# Patient Record
Sex: Female | Born: 1975 | Race: White | Hispanic: No | Marital: Single | Smoking: Never smoker
Health system: Southern US, Community
[De-identification: ages and names within clinical notes are randomized; demographics above are authoritative.]

## PROBLEM LIST (undated history)

## (undated) DIAGNOSIS — I10 Essential (primary) hypertension: Secondary | ICD-10-CM

## (undated) DIAGNOSIS — D649 Anemia, unspecified: Secondary | ICD-10-CM

## (undated) DIAGNOSIS — G43909 Migraine, unspecified, not intractable, without status migrainosus: Secondary | ICD-10-CM

## (undated) HISTORY — PX: TUBAL LIGATION: SHX77

## (undated) HISTORY — DX: Anemia, unspecified: D64.9

## (undated) HISTORY — DX: Essential (primary) hypertension: I10

---

## 1997-10-11 ENCOUNTER — Other Ambulatory Visit: Admission: RE | Admit: 1997-10-11 | Discharge: 1997-10-11 | Payer: Self-pay | Admitting: Obstetrics and Gynecology

## 1998-05-18 ENCOUNTER — Other Ambulatory Visit: Admission: RE | Admit: 1998-05-18 | Discharge: 1998-05-18 | Payer: Self-pay | Admitting: Obstetrics and Gynecology

## 1999-08-08 ENCOUNTER — Other Ambulatory Visit: Admission: RE | Admit: 1999-08-08 | Discharge: 1999-08-08 | Payer: Self-pay | Admitting: Gynecology

## 2008-02-10 ENCOUNTER — Inpatient Hospital Stay (HOSPITAL_COMMUNITY): Admission: AD | Admit: 2008-02-10 | Discharge: 2008-02-10 | Payer: Self-pay | Admitting: Obstetrics & Gynecology

## 2009-10-05 ENCOUNTER — Encounter: Admission: RE | Admit: 2009-10-05 | Discharge: 2009-10-05 | Payer: Self-pay | Admitting: Family Medicine

## 2011-03-06 LAB — URINALYSIS, ROUTINE W REFLEX MICROSCOPIC
Bilirubin Urine: NEGATIVE
Hgb urine dipstick: NEGATIVE
Specific Gravity, Urine: >1.03 — ABNORMAL HIGH

## 2011-03-06 LAB — CBC
HCT: 40.7
Hemoglobin: 13.5
MCHC: 33.2
MCV: 92.3
Platelets: 225
RDW: 12.5
WBC: 9.8

## 2011-03-06 LAB — WET PREP, GENITAL
Clue Cells Wet Prep HPF POC: NONE SEEN
Yeast Wet Prep HPF POC: NONE SEEN

## 2011-03-06 LAB — GC/CHLAMYDIA PROBE AMP, GENITAL: GC Probe Amp, Genital: NEGATIVE

## 2011-03-06 LAB — POCT PREGNANCY, URINE: Preg Test, Ur: NEGATIVE

## 2011-12-26 ENCOUNTER — Ambulatory Visit (INDEPENDENT_AMBULATORY_CARE_PROVIDER_SITE_OTHER): Payer: BC Managed Care – PPO | Admitting: Family Medicine

## 2011-12-26 VITALS — BP 116/76 | HR 78 | Temp 98.7°F | Resp 16 | Ht 63.0 in | Wt 172.0 lb

## 2011-12-26 DIAGNOSIS — IMO0002 Reserved for concepts with insufficient information to code with codable children: Secondary | ICD-10-CM

## 2011-12-26 DIAGNOSIS — M25549 Pain in joints of unspecified hand: Secondary | ICD-10-CM

## 2011-12-26 MED ORDER — TRAMADOL HCL 50 MG PO TABS: 50.0000 mg | ORAL_TABLET | Freq: Three times a day (TID) | ORAL | Status: AC | PRN

## 2011-12-26 NOTE — Patient Instructions (Addendum)
Take the Tramadol every 6 hours for pain relief.   Change the bandage as you need to and soak as instructed. Come back Thursday or Friday next week so we can look at your thumb and see how it's doing.  If you actually do have a fungus you may need a medicine for that.   It was good to meet you!

## 2011-12-26 NOTE — Progress Notes (Signed)
Patient ID: Sandy Ramsey, female   DOB: 1975/12/30, 36 y.o.   MRN: [id number] Sandy Ramsey is a 36 y.o. female who presents to Urgent Care today for Left thumb pain:  1.  Left thumb pain:  Present for 1-2 months.  Works as Sports administrator.  Has been using increased rust remover chemical at work for past several weeks and has had worsening tingling in her fingers since then.  Noted swelling and pain Left thumb x 1 week.  Increasing in pain and redness.  Pain radiates down left thumb, pain with movement.  Has tried OTC Advil without relief of pain or swelling.   PMH reviewed.  ROS as above otherwise neg.  No chest pain, palpitations, SOB, Fever, Chills, Abd pain, N/V/D.  Medications reviewed. No current outpatient prescriptions on file.    Exam:  BP 116/76  Pulse 78  Temp 98.7 F (37.1 C) (Oral)  Resp 16  Ht 5\' 3"  (1.6 m)  Wt 172 lb (78.019 kg)  BMI 30.47 kg/m2  SpO2 100% Gen: Well NAD HEENT: EOMI,  MMM Lungs: CTABL Nl WOB Heart: RRR no MRG Abd: NABS, NT, ND Exts: Left thumb with paronychia noted lateral nail fold.  Tender to palpation. Also tender to palpation along extensor tendon, although no redness or swelling here.  No anatomic snuffbox tenderness.   Neuro:  Sensation intact all distal fingers. CV:  Radial/ulnar pulses +2 BL   Assessment and Plan:  1.  Paronychia:  No red flags by history or physical.  Incised and drained in clinic.  Much relief afterwards.  Believe this is likely cause of her pain.  However she might also have de Quervain's tenosynovitis as she was tender along extensor tendon.  Unsure about this, she should return next week for recheck of her wound and to further examine whether pain is totally relieved or if she is having some tendonitis as well.  Ibuprofen as anti-inflammatory along with prescription Tramadol for short term pain relief.

## 2011-12-26 NOTE — Progress Notes (Signed)
VCO.  Digital block with 4 cc 2% lidocaine plain.  Sterile prep with betadine.  11 blade to incise along the lateral nail fold.  Moderate purulence expressed.  Cleansed.  Antibiotic ointment and bandage applied.    Advised warm water soaks, keep the wound covered while at work.

## 2011-12-30 LAB — WOUND CULTURE
Gram Stain: NONE SEEN
Organism ID, Bacteria: NO GROWTH

## 2012-03-02 ENCOUNTER — Ambulatory Visit (INDEPENDENT_AMBULATORY_CARE_PROVIDER_SITE_OTHER): Payer: BC Managed Care – PPO | Admitting: Family Medicine

## 2012-03-02 VITALS — BP 118/74 | HR 87 | Temp 98.4°F | Resp 16 | Ht 63.0 in | Wt 179.0 lb

## 2012-03-02 DIAGNOSIS — Z23 Encounter for immunization: Secondary | ICD-10-CM

## 2012-03-02 DIAGNOSIS — Z0000 Encounter for general adult medical examination without abnormal findings: Secondary | ICD-10-CM

## 2012-03-02 DIAGNOSIS — R350 Frequency of micturition: Secondary | ICD-10-CM

## 2012-03-02 LAB — COMPREHENSIVE METABOLIC PANEL
AST: 16 U/L (ref 0–37)
Alkaline Phosphatase: 62 U/L (ref 39–117)
Chloride: 106 mEq/L (ref 96–112)
Creat: 0.63 mg/dL (ref 0.50–1.10)
Sodium: 139 mEq/L (ref 135–145)
Total Protein: 6.9 g/dL (ref 6.0–8.3)

## 2012-03-02 LAB — POCT CBC
Granulocyte percent: 70.3 %G (ref 37–80)
MCHC: 30.8 g/dL — AB (ref 31.8–35.4)
MCV: 93.7 fL (ref 80–97)
MID (cbc): 0.4 (ref 0–0.9)
MPV: 9 fL (ref 0–99.8)
POC MID %: 5.3 %M (ref 0–12)
Platelet Count, POC: 305 10*3/uL (ref 142–424)

## 2012-03-02 LAB — POCT UA - MICROSCOPIC ONLY
Bacteria, U Microscopic: NEGATIVE
Crystals, Ur, HPF, POC: NEGATIVE
Mucus, UA: POSITIVE
Yeast, UA: NEGATIVE

## 2012-03-02 LAB — POCT URINALYSIS DIPSTICK
Bilirubin, UA: NEGATIVE
Glucose, UA: NEGATIVE
Ketones, UA: NEGATIVE
Leukocytes, UA: NEGATIVE
Nitrite, UA: NEGATIVE
Protein, UA: NEGATIVE
pH, UA: 7

## 2012-03-02 LAB — POCT URINE PREGNANCY: Preg Test, Ur: NEGATIVE

## 2012-03-02 NOTE — Progress Notes (Signed)
[facility] [address], [place] [address] [phone]  Date:  03/02/2012   Name:  Sandy Ramsey   DOB:  1975-08-07   MRN:  [id number]  PCP:  Abbe Amsterdam, MD    Chief Complaint: Complete PE with PPW   History of Present Illness:  Sandy Ramsey is a 36 y.o. very pleasant female patient who presents with the following:  She is here to have a CPE today and to have a form completed for her job.  She had a baseline mammogram at age 56- told to follow- up at 36 years old.  She has had a tubal ligation, and her LMP started yesterday.  She is not fasting right now- just had a protein bar for lunch.  Her last tetanus shot was about 7 years ago.    She has noted some lower abdomen/ bladder discomfort after sex for the last few months.  Her discomfort seems to resolve after she drinks a lot of water for a couple of days.  She can also have an ache in her lower back.  She does not have vaginal pain or discharge.  No pain during sex.   There is no problem list on file for this patient.   No past medical history on file.  No past surgical history on file.  History  Substance Use Topics  . Smoking status: Never Smoker   . Smokeless tobacco: Not on file  . Alcohol Use: Not on file    No family history on file.  No Known Allergies  Medication list has been reviewed and updated.  No current outpatient prescriptions on file prior to visit.    Review of Systems:  As per HPI- otherwise negative.   Physical Examination: Filed Vitals:   03/02/12 1430  BP: 118/74  Pulse: 87  Temp: 98.4 F (36.9 C)  Resp: 16   Filed Vitals:   03/02/12 1430  Height: 5\' 3"  (1.6 m)  Weight: 179 lb (81.194 kg)   Body mass index is 31.71 kg/(m^2). Ideal Body Weight: Weight in (lb) to have BMI = 25: 140.8   GEN: WDWN, NAD, Non-toxic, A & O x 3 HEENT: Atraumatic, Normocephalic. Neck supple. No masses, No LAD. Bilateral TM wnl, oropharynx normal.  PEERL,EOMI.     Ears and Nose: No external deformity. CV: RRR, No M/G/R. No JVD. No thrill. No extra heart sounds. PULM: CTA B, no wheezes, crackles, rhonchi. No retractions. No resp. distress. No accessory muscle use. ABD: S, NT, ND, +BS. No rebound. No HSM. EXTR: No c/c/e NEURO Normal gait.  PSYCH: Normally interactive. Conversant. Not depressed or anxious appearing.  Calm demeanor  Results for orders placed in visit on 03/02/12  POCT CBC      Component Value Range   WBC 8.1  4.6 - 10.2 K/uL   Lymph, poc 2.0  0.6 - 3.4   POC LYMPH PERCENT 24.4  10 - 50 %L   MID (cbc) 0.4  0 - 0.9   POC MID % 5.3  0 - 12 %M   POC Granulocyte 5.7  2 - 6.9   Granulocyte percent 70.3  37 - 80 %G   RBC 4.26  4.04 - 5.48 M/uL   Hemoglobin 12.3  12.2 - 16.2 g/dL   HCT, POC 29.5  62.1 - 47.9 %   MCV 93.7  80 - 97 fL   MCH, POC 28.9  27 - 31.2 pg   MCHC 30.8 (*) 31.8 -  35.4 g/dL   RDW, POC 16.1     Platelet Count, POC 305  142 - 424 K/uL   MPV 9.0  0 - 99.8 fL  POCT UA - MICROSCOPIC ONLY      Component Value Range   WBC, Ur, HPF, POC neg     RBC, urine, microscopic 3-5     Bacteria, U Microscopic neg     Mucus, UA positive     Epithelial cells, urine per micros neg     Crystals, Ur, HPF, POC neg     Casts, Ur, LPF, POC neg     Yeast, UA neg    POCT URINE PREGNANCY      Component Value Range   Preg Test, Ur Negative    POCT URINALYSIS DIPSTICK      Component Value Range   Color, UA yellow     Clarity, UA clear     Glucose, UA neg     Bilirubin, UA neg     Ketones, UA neg     Spec Grav, UA 1.010     Blood, UA moderate     pH, UA 7.0     Protein, UA neg     Urobilinogen, UA 0.2     Nitrite, UA neg     Leukocytes, UA Negative       Assessment and Plan: 1. Physical exam, annual  Flu vaccine greater than or equal to 3yo preservative free IM, POCT CBC, Comprehensive metabolic panel, TSH, LDL cholesterol, direct, POCT urinalysis dipstick  2. Urinary frequency  POCT UA - Microscopic Only, POCT urine  pregnancy, POCT urinalysis dipstick  completed PE except for pap/ breast exam today,  She plans to have these services performed once her period is over.  At that time will also perform a pelvic exam to rule- out any other cause of her post- coital discomfort.  She does not seem to have a UTI.  There is a little blood in her urine but she has her menses today.    Otherwise she is doing well, not smoking and her BP looks good.  Gave flu shot today, await other labs as above   COPLAND,JESSICA, MD

## 2012-03-04 ENCOUNTER — Encounter: Payer: Self-pay | Admitting: Family Medicine

## 2012-03-23 ENCOUNTER — Other Ambulatory Visit: Payer: Self-pay | Admitting: Family Medicine

## 2012-03-23 DIAGNOSIS — R51 Headache: Secondary | ICD-10-CM

## 2012-03-25 ENCOUNTER — Other Ambulatory Visit: Payer: Self-pay

## 2012-09-22 ENCOUNTER — Ambulatory Visit: Payer: BC Managed Care – PPO

## 2012-09-22 ENCOUNTER — Ambulatory Visit (INDEPENDENT_AMBULATORY_CARE_PROVIDER_SITE_OTHER): Payer: BC Managed Care – PPO | Admitting: Family Medicine

## 2012-09-22 VITALS — BP 124/76 | HR 73 | Temp 99.4°F | Resp 16 | Ht 63.5 in | Wt 174.0 lb

## 2012-09-22 DIAGNOSIS — M549 Dorsalgia, unspecified: Secondary | ICD-10-CM

## 2012-09-22 LAB — POCT URINALYSIS DIPSTICK
Bilirubin, UA: NEGATIVE
Blood, UA: NEGATIVE
Glucose, UA: NEGATIVE
Protein, UA: NEGATIVE
Spec Grav, UA: 1.015
Urobilinogen, UA: 1

## 2012-09-22 LAB — POCT UA - MICROSCOPIC ONLY
Casts, Ur, LPF, POC: NEGATIVE
Crystals, Ur, HPF, POC: NEGATIVE
Yeast, UA: NEGATIVE

## 2012-09-22 MED ORDER — HYDROCODONE-ACETAMINOPHEN 5-325 MG PO TABS: 1.0000 | ORAL_TABLET | Freq: Three times a day (TID) | ORAL | Status: DC | PRN

## 2012-09-22 MED ORDER — PREDNISONE 20 MG PO TABS: ORAL_TABLET | ORAL | Status: DC

## 2012-09-22 NOTE — Patient Instructions (Addendum)
Use the vicodin as needed for pain, but do not drive or operate machinery while on this medication.  Use the prednisone as directed.  If you are not feeling better in the next couple of days please let me know- Sooner if worse.   If you have any increased numbness, weakness or any issues with bowel or bladder control please seek care right away

## 2012-09-22 NOTE — Progress Notes (Signed)
[facility] [address] [place] [address] [phone]  Date:  09/22/2012   Name:  Sandy Ramsey   DOB:  September 21, 1975   MRN:  [id number]  PCP:  Abbe Amsterdam, MD    Chief Complaint: Back Pain   History of Present Illness:  Sandy Ramsey is a 37 y.o. very pleasant female patient who presents with the following:  She noted onset of back pain 3 days ago. She drove to [facility] and developed pain during the drive.  It has continued to get worse over the last couple of days.  The pain is located in her bilateral lower back.  She cannot pinpoint any particular area of pain.  No other unusual activities or acute injury that she is aware of.     She noted intermittent hand numbness over the last week or so, but she has been having pain in her hands for a couple of weeks. Right more than left.  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  The numbness seems to be more in the ulnar side of her hands.    She has also some tingling in both feet just today.  "not really numb, just tingling."  No urine/ stool control problems, no saddle anesthesia  She has had some muscle pain in her back in the past, but never quite like this.   LLMP- ended yesterday  She is otherwise generally healthy   There is no problem list on file for this patient.   Past Medical History  Diagnosis Date  . Hypertension     Past Surgical History  Procedure Laterality Date  . Tubal ligation    . Cesarean section      History  Substance Use Topics  . Smoking status: Never Smoker   . Smokeless tobacco: Never Used  . Alcohol Use: 2.4 oz/week    2 Glasses of wine, 2 Cans of beer per week     Comment: occasionally    Family History  Problem Relation Age of Onset  . Hypertension Mother   . Hypertension Father   . Cancer Maternal Grandmother   . Cancer Maternal Grandfather   . Cancer Paternal Grandmother   . Cancer Paternal Grandfather     No Known  Allergies  Medication list has been reviewed and updated.  No current outpatient prescriptions on file prior to visit.   No current facility-administered medications on file prior to visit.    Review of Systems:  As per HPI- otherwise negative.   Physical Examination: Filed Vitals:   09/22/12 1446  BP: 124/76  Pulse: 73  Temp: 99.4 F (37.4 C)  Resp: 16   Filed Vitals:   09/22/12 1446  Height: 5' 3.5" (1.613 m)  Weight: 174 lb (78.926 kg)   Body mass index is 30.34 kg/(m^2). Ideal Body Weight: Weight in (lb) to have BMI = 25: 143.1  GEN: WDWN, NAD, Non-toxic, A & O x 3, looks well has back pain with movement HEENT: Atraumatic, Normocephalic. Neck supple. No masses, No LAD. Ears and Nose: No external deformity. CV: RRR, No M/G/R. No JVD. No thrill. No extra heart sounds. PULM: CTA B, no wheezes, crackles, rhonchi. No retractions. No resp. distress. No accessory muscle use. ABD: S, NT, ND. No rebound. No HSM. EXTR: No c/c/e NEURO Normal gait.  PSYCH: Normally interactive. Conversant. Not depressed or anxious appearing.  Calm demeanor.  She has tenderness and spasm over her  lower back muscles.  Discomfort with bilateral SLR. Normal patellar and ankle DTR.  Normal strength and sensation both LE, no saddle anesthesia.   Ok with spine flexion, but pain with extension  [facility] reading (PRIMARY) by  Dr. Patsy Lager. Lumbar series; negative  LUMBAR SPINE - COMPLETE 4+ VIEW  Comparison: None.  Findings: Frontal, lateral, spot lumbosacral lateral, and bilateral oblique views were obtained. There are five non-rib bearing lumbar type vertebral bodies. There is no fracture or spondylolisthesis. Disc spaces appear intact. There is no appreciable facet arthropathy.  IMPRESSION: No abnormality noted.   Results for orders placed in visit on 09/22/12  POCT UA - MICROSCOPIC ONLY      Result Value Range   WBC, Ur, HPF, POC neg     RBC, urine, microscopic 0-1     Bacteria, U  Microscopic 1+     Mucus, UA neg     Epithelial cells, urine per micros 0-1     Crystals, Ur, HPF, POC neg     Casts, Ur, LPF, POC neg     Yeast, UA neg    POCT URINALYSIS DIPSTICK      Result Value Range   Color, UA yellow     Clarity, UA cloudy     Glucose, UA neg     Bilirubin, UA neg     Ketones, UA neg     Spec Grav, UA 1.015     Blood, UA neg     pH, UA 7.0     Protein, UA neg     Urobilinogen, UA 1.0     Nitrite, UA neg     Leukocytes, UA Negative      Assessment and Plan: Back pain - Plan: POCT UA - Microscopic Only, POCT urinalysis dipstick, DG Lumbar Spine Complete, predniSONE (DELTASONE) 20 MG tablet, HYDROcodone-acetaminophen (NORCO/VICODIN) 5-325 MG per tablet  MSK back pain with possible nerve root irritation causing tingling in both feet.  Will treat for pain with vicodin and use prednisone.  Went over si/ sx of CES with her.  She will report if any worsening or if not better in a few days  She was noted to have microhematuria at her last PE- however she had her menses then.  Urine now clear  Signed Abbe Amsterdam, MD

## 2012-09-24 ENCOUNTER — Encounter: Payer: Self-pay | Admitting: Family Medicine

## 2013-04-05 ENCOUNTER — Ambulatory Visit (INDEPENDENT_AMBULATORY_CARE_PROVIDER_SITE_OTHER): Payer: BC Managed Care – PPO | Admitting: Physician Assistant

## 2013-04-05 VITALS — BP 100/60 | HR 72 | Temp 98.1°F | Resp 16 | Ht 63.75 in | Wt 174.0 lb

## 2013-04-05 DIAGNOSIS — Z0000 Encounter for general adult medical examination without abnormal findings: Secondary | ICD-10-CM

## 2013-04-05 LAB — POCT CBC
Granulocyte percent: 71.4 %G (ref 37–80)
MCH, POC: 28.1 pg (ref 27–31.2)
MCV: 92.2 fL (ref 80–97)
MPV: 8.1 fL (ref 0–99.8)
POC MID %: 4.4 %M (ref 0–12)
RDW, POC: 14.6 %
WBC: 7.4 10*3/uL (ref 4.6–10.2)

## 2013-04-05 LAB — LIPID PANEL
Cholesterol: 200 mg/dL (ref 0–200)
HDL: 65 mg/dL (ref >39)
LDL Cholesterol: 114 mg/dL — ABNORMAL HIGH (ref 0–99)
Total CHOL/HDL Ratio: 3.1 Ratio
VLDL: 21 mg/dL (ref 0–40)

## 2013-04-05 LAB — TSH: TSH: 0.947 u[IU]/mL (ref 0.350–4.500)

## 2013-04-05 LAB — COMPREHENSIVE METABOLIC PANEL
AST: 15 U/L (ref 0–37)
Calcium: 8.9 mg/dL (ref 8.4–10.5)
Glucose, Bld: 69 mg/dL — ABNORMAL LOW (ref 70–99)

## 2013-04-05 NOTE — Progress Notes (Signed)
Subjective:    Patient ID: Sandy Ramsey, female    DOB: [birth year], 37 y.o.   MRN: [id number]  HPI  Sandy Ramsey is a very pleasant 37 yr old female here for CPE.  Last CPE a year ago.  Needs annual CPEs for work, but does not have ppw to be completed.    Complaints:  none LMP:  04/01/13, regular every month Sexually active, no concern for STI, declines testing Contraception: tubal ligation Pap/pelvic/breast/mammo:  Last pap less than a year ago; had mammogram a couple years ago due to bump, normal - recommended to start annual mammos at age 30 Dentist: twice yearly Eye doctor: glasses for reading, last eye doctor about 2 years ago - increased HAs, thinks needs prescription adjusted, plans to sched appt soon Imm:  utd as far as she knows, declines flu shot today Diet:  "pretty healthy", tries to avoid fast food, fried food; "water drinker" maybe 1 tea a day Exercise:  Cardio 30-45 minutes and free weights, "off the wagon" for about a month though Meds:  None PMH: none Family history: HTN in both parents, cancer in grandmothers but pt admits she does not know much of her fam hx Tobacco:  No smoking Etoh: maybe twice a month - 6 pack at a time No other substances   Review of Systems  Constitutional: Negative.   HENT: Negative.   Respiratory: Negative.   Cardiovascular: Negative.   Gastrointestinal: Positive for constipation (but this is her normal). Negative for nausea, vomiting, abdominal pain and diarrhea.  Genitourinary: Negative.   Musculoskeletal: Negative.   Skin: Negative.   Neurological: Negative.        Objective:   Physical Exam  Vitals reviewed. Constitutional: She is oriented to person, place, and time. She appears well-developed and well-nourished. No distress.  HENT:  Head: Normocephalic and atraumatic.  Right Ear: Tympanic membrane and ear canal normal.  Left Ear: Tympanic membrane and ear canal normal.  Mouth/Throat: Uvula is midline, oropharynx is clear  and moist and mucous membranes are normal.  Eyes: Conjunctivae and EOM are normal. Pupils are equal, round, and reactive to light. No scleral icterus.  Neck: Normal range of motion. Neck supple.  Cardiovascular: Normal rate, regular rhythm and normal heart sounds.   Pulmonary/Chest: Effort normal and breath sounds normal. She has no wheezes. She has no rales.  Abdominal: Soft. Bowel sounds are normal. There is no tenderness.  Genitourinary:  Deferred as not due for pap and no complaints or concerns for STI  Musculoskeletal: Normal range of motion.  Lymphadenopathy:    She has no cervical adenopathy.  Neurological: She is alert and oriented to person, place, and time. She has normal reflexes.  Skin: Skin is warm and dry.  Psychiatric: She has a normal mood and affect. Her behavior is normal.   Results for orders placed in visit on 04/05/13  POCT CBC      Result Value Range   WBC 7.4  4.6 - 10.2 K/uL   Lymph, poc 1.8  0.6 - 3.4   POC LYMPH PERCENT 24.2  10 - 50 %L   MID (cbc) 0.3  0 - 0.9   POC MID % 4.4  0 - 12 %M   POC Granulocyte 5.3  2 - 6.9   Granulocyte percent 71.4  37 - 80 %G   RBC 4.23  4.04 - 5.48 M/uL   Hemoglobin 11.9 (*) 12.2 - 16.2 g/dL   HCT, POC 40.9  81.1 - 47.9 %  MCV 92.2  80 - 97 fL   MCH, POC 28.1  27 - 31.2 pg   MCHC 30.5 (*) 31.8 - 35.4 g/dL   RDW, POC 81.1     Platelet Count, POC 280  142 - 424 K/uL   MPV 8.1  0 - 99.8 fL        Assessment & Plan:  Routine general medical examination at a health care facility - Plan: POCT CBC, Comprehensive metabolic panel, TSH, Lipid panel   [name] is a very pleasant 37 yr old female here for CPE.   - She appears to be in good health and has no complaints today.   - Exam is normal.   - CBC is normal.   - CMP, lipid panel, TSH pending.   - Due for pap in 2 years - Due for screening mammogram next year - Discussed health maintenance, and edu materials printed for her - Will follow up on labs - Pt to RTC  as needs arise  E. Frances Furbish MHS, PA-C [facility] 11/3/201412:52 PM

## 2013-04-05 NOTE — Patient Instructions (Signed)

## 2013-04-07 ENCOUNTER — Telehealth: Payer: Self-pay

## 2013-04-07 NOTE — Telephone Encounter (Signed)
faxed

## 2013-04-07 NOTE — Telephone Encounter (Signed)
Pt was seen Monday for a cpe and was given a return to work note to show her employer that she came in and got a cpe and she is calling back stating that isnt sufficient enough for the employer and would like to know if something could be faxed to the employer showing that she did get cpe fax number is [phone]

## 2013-10-13 ENCOUNTER — Ambulatory Visit (INDEPENDENT_AMBULATORY_CARE_PROVIDER_SITE_OTHER): Payer: BC Managed Care – PPO | Admitting: Family Medicine

## 2013-10-13 VITALS — BP 120/80 | HR 94 | Temp 98.4°F | Resp 16 | Ht 63.0 in | Wt 158.0 lb

## 2013-10-13 DIAGNOSIS — N760 Acute vaginitis: Secondary | ICD-10-CM

## 2013-10-13 DIAGNOSIS — B9689 Other specified bacterial agents as the cause of diseases classified elsewhere: Secondary | ICD-10-CM

## 2013-10-13 DIAGNOSIS — Z124 Encounter for screening for malignant neoplasm of cervix: Secondary | ICD-10-CM

## 2013-10-13 DIAGNOSIS — Z112 Encounter for screening for other bacterial diseases: Secondary | ICD-10-CM

## 2013-10-13 DIAGNOSIS — Z113 Encounter for screening for infections with a predominantly sexual mode of transmission: Secondary | ICD-10-CM

## 2013-10-13 DIAGNOSIS — Z118 Encounter for screening for other infectious and parasitic diseases: Secondary | ICD-10-CM

## 2013-10-13 NOTE — Patient Instructions (Signed)
I will be in touch with your labs and will discuss them with you.  In the meantime try not to worry!

## 2013-10-13 NOTE — Progress Notes (Addendum)
[facility] [address], [place] [id number] [phone]  Date:  10/13/2013   Name:  Sandy SalinesSonia P Ramsey   DOB:  09/06/1975   MRN:  [id number]  PCP:  Abbe AmsterdamOPLAND,Lux Meaders, MD    Chief Complaint: Exposure to STD   History of Present Illness:  Sandy SalinesSonia P Ramsey is a 38 y.o. very pleasant female patient who presents with the following:  Here today with concern regarding exposure to HSV.  No recent STI work- up.   She has been together with her BF for about 3 months.   They were apart for a few months about a year ago, and her BF found out that he was infected with HSV while they were apart.  He just admitted this to her and she is pretty upset.  However it is not clear if he actually ever had an outbreak or just had a blood test that led to this dx.    She has not noted any HSV lesions or genital lesions.  She would like a pap today.   She is not quite sure of the date of her last pap but knows that she is due Her menses are somewhat irregular always- however she has had a tubal ligation   There are no active problems to display for this patient.   Past Medical History  Diagnosis Date  . Hypertension   . Anemia     Past Surgical History  Procedure Laterality Date  . Tubal ligation    . Cesarean section      History  Substance Use Topics  . Smoking status: Never Smoker   . Smokeless tobacco: Never Used  . Alcohol Use: 2.4 oz/week    2 Glasses of wine, 2 Cans of beer per week     Comment: occasionally    Family History  Problem Relation Age of Onset  . Hypertension Mother   . Hyperlipidemia Mother   . Hypertension Father   . Diabetes Father   . Cancer Maternal Grandmother   . Cancer Maternal Grandfather   . Cancer Paternal Grandmother   . Cancer Paternal Grandfather     No Known Allergies  Medication list has been reviewed and updated.  No current outpatient prescriptions on file prior to visit.   No current facility-administered medications  on file prior to visit.    Review of Systems:  As per HPI- otherwise negative.   Physical Examination: Filed Vitals:   10/13/13 1542  BP: 120/80  Pulse: 94  Temp: 98.4 F (36.9 C)  Resp: 16   Filed Vitals:   10/13/13 1542  Height: 5\' 3"  (1.6 m)  Weight: 158 lb (71.668 kg)   Body mass index is 28 kg/(m^2). Ideal Body Weight: Weight in (lb) to have BMI = 25: 140.8  GEN: WDWN, NAD, Non-toxic, A & O x 3, looks well HEENT: Atraumatic, Normocephalic. Neck supple. No masses, No LAD. Ears and Nose: No external deformity. CV: RRR, No M/G/R. No JVD. No thrill. No extra heart sounds. PULM: CTA B, no wheezes, crackles, rhonchi. No retractions. No resp. distress. No accessory muscle use. ABD: S, NT, ND. No rebound. No HSM. EXTR: No c/c/e NEURO Normal gait.  PSYCH: Normally interactive. Conversant. Not depressed or anxious appearing.  Calm demeanor.  Gu: normal exam, no external lesions or vesicles.  Normal vaginal canal, no CMT, no adnexal masses or tenderness.   Assessment and Plan: Routine screening for STI (sexually transmitted infection) - Plan: Pap IG, CT/NG w/  reflex HPV when ASC-U, HIV antibody, HSV(herpes simplex vrs) 1+2 ab-IgG, Hepatitis B surface antibody, Hepatitis B surface antigen, Hepatitis C antibody, RPR  Screening for cervical cancer - Plan: Pap IG, CT/NG w/ reflex HPV when ASC-U  Screening for STI and cervical cancer as above.  Explained that HSV is most transmissible when someone has an outbreak, but can be transmitted anytime.  If she is negative we will consider suppressive therapy for her BF.   Will plan further follow- up pending labs.   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  Went over this and answered questions.  She is s/p BTL so pregnancy is not a likely issue for her. Her BF is also positive for HSV 2.  She will let me know if she has any sx.  Also, she did have suggestion of BV on her pap.   Will send in an rx for flagyl.  However if she has no sx such as discomfort or odor, she does not necessarily have to take the medication.  She states understanding

## 2013-10-14 LAB — PAP IG, CT-NG, RFX HPV ASCU
CHLAMYDIA PROBE AMP: NEGATIVE
GC PROBE AMP: NEGATIVE

## 2013-10-14 LAB — HSV(HERPES SIMPLEX VRS) I + II AB-IGG: HSV 2 GLYCOPROTEIN G AB, IGG: 5.42 IV — AB

## 2013-10-14 LAB — HEPATITIS B SURFACE ANTIBODY, QUANTITATIVE: HEPATITIS B-POST: 0.4 m[IU]/mL

## 2013-10-14 LAB — RPR

## 2013-10-14 LAB — HIV ANTIBODY (ROUTINE TESTING W REFLEX): HIV 1&2 Ab, 4th Generation: NONREACTIVE

## 2013-10-14 LAB — HEPATITIS C ANTIBODY: HCV AB: NEGATIVE

## 2013-10-14 LAB — HEPATITIS B SURFACE ANTIGEN: Hepatitis B Surface Ag: NEGATIVE

## 2013-10-15 ENCOUNTER — Encounter: Payer: Self-pay | Admitting: Family Medicine

## 2013-10-15 MED ORDER — METRONIDAZOLE 500 MG PO TABS
500.0000 mg | ORAL_TABLET | Freq: Two times a day (BID) | ORAL | Status: DC
Start: 2013-10-15 — End: 2021-09-20

## 2013-10-15 NOTE — Addendum Note (Signed)
Addended by: Abbe AmsterdamOPLAND, Farryn Linares C on: 10/15/2013 09:52 AM   Modules accepted: Orders

## 2013-10-18 ENCOUNTER — Telehealth: Payer: Self-pay

## 2013-10-18 DIAGNOSIS — B009 Herpesviral infection, unspecified: Secondary | ICD-10-CM

## 2013-10-18 MED ORDER — VALACYCLOVIR HCL 1 G PO TABS: 1000.0000 mg | ORAL_TABLET | Freq: Every day | ORAL | Status: DC

## 2013-10-18 NOTE — Telephone Encounter (Signed)
Called her back- while I do not think that her chronic leg and back pain is likely due to HSV, if she would like to try taking suppression dose valtrx I do not think it will harm her.  She would like to try this. She will let me know if I can do anything else to help

## 2013-10-18 NOTE — Telephone Encounter (Signed)
PT STATES SHE HAVE BEEN SPEAKING WITH DR COPLAND AND WOULD LIKE TO TALK WITH HER ASST ABOUT SOME MEDICINE SHE WOULD LIKE TO HAVE CALLED IN, DIDN'T KNOW THE NAME OF THEM BUT ONCE SHE SPEAK WITH SOMEONE, THEY WOULD BE ABLE TO TELL HER WHAT SHE NEED. PLEASE CALL [phone]

## 2013-10-18 NOTE — Telephone Encounter (Signed)
Patient states she was seen by DR. Copland last week and was diagnosed with herpes.  States that she has been having pain in her back and legs for the past 6 months and read online that valtrex may help this.  Could you please call in an RX for valtrex for the patient?  Please advise.

## 2014-06-19 ENCOUNTER — Telehealth: Payer: Self-pay

## 2014-06-19 ENCOUNTER — Ambulatory Visit (INDEPENDENT_AMBULATORY_CARE_PROVIDER_SITE_OTHER): Payer: BLUE CROSS/BLUE SHIELD | Admitting: Family Medicine

## 2014-06-19 VITALS — BP 132/88 | HR 86 | Temp 98.8°F | Resp 19 | Ht 63.5 in | Wt 155.0 lb

## 2014-06-19 DIAGNOSIS — Z0000 Encounter for general adult medical examination without abnormal findings: Secondary | ICD-10-CM

## 2014-06-19 DIAGNOSIS — Z13220 Encounter for screening for lipoid disorders: Secondary | ICD-10-CM

## 2014-06-19 DIAGNOSIS — G4700 Insomnia, unspecified: Secondary | ICD-10-CM

## 2014-06-19 LAB — COMPREHENSIVE METABOLIC PANEL
ALT: 18 U/L (ref 0–35)
AST: 17 U/L (ref 0–37)
Albumin: 4.4 g/dL (ref 3.5–5.2)
Alkaline Phosphatase: 61 U/L (ref 39–117)
BUN: 15 mg/dL (ref 6–23)
CO2: 22 mEq/L (ref 19–32)
Calcium: 9.3 mg/dL (ref 8.4–10.5)
Chloride: 106 mEq/L (ref 96–112)
Creat: 0.54 mg/dL (ref 0.50–1.10)
Glucose, Bld: 80 mg/dL (ref 70–99)
Potassium: 4 mEq/L (ref 3.5–5.3)
Sodium: 139 mEq/L (ref 135–145)
Total Bilirubin: 0.6 mg/dL (ref 0.2–1.2)
Total Protein: 7.4 g/dL (ref 6.0–8.3)

## 2014-06-19 LAB — POCT URINALYSIS DIPSTICK
Bilirubin, UA: NEGATIVE
Glucose, UA: NEGATIVE
Ketones, UA: 40
Leukocytes, UA: NEGATIVE
Nitrite, UA: NEGATIVE
Protein, UA: NEGATIVE
Spec Grav, UA: 1.025
Urobilinogen, UA: 0.2
pH, UA: 6

## 2014-06-19 LAB — POCT CBC
Granulocyte percent: 75.2 %G (ref 37–80)
HCT, POC: 40.8 % (ref 37.7–47.9)
Hemoglobin: 12.1 g/dL — AB (ref 12.2–16.2)
Lymph, poc: 2 (ref 0.6–3.4)
MCH, POC: 26.3 pg — AB (ref 27–31.2)
MCHC: 29.7 g/dL — AB (ref 31.8–35.4)
MCV: 88.3 fL (ref 80–97)
MID (cbc): 0.5 (ref 0–0.9)
MPV: 7 fL (ref 0–99.8)
POC Granulocyte: 7.6 — AB (ref 2–6.9)
POC LYMPH PERCENT: 19.4 %L (ref 10–50)
POC MID %: 5.4 %M (ref 0–12)
Platelet Count, POC: 330 10*3/uL (ref 142–424)
RBC: 4.62 M/uL (ref 4.04–5.48)
RDW, POC: 17.7 %
WBC: 10.1 10*3/uL (ref 4.6–10.2)

## 2014-06-19 LAB — LIPID PANEL
Cholesterol: 195 mg/dL (ref 0–200)
HDL: 76 mg/dL (ref >39)
LDL Cholesterol: 104 mg/dL — ABNORMAL HIGH (ref 0–99)
Total CHOL/HDL Ratio: 2.6 Ratio
Triglycerides: 73 mg/dL (ref <150)
VLDL: 15 mg/dL (ref 0–40)

## 2014-06-19 MED ORDER — TRAZODONE HCL 50 MG PO TABS
25.0000 mg | ORAL_TABLET | Freq: Every evening | ORAL | Status: DC | PRN
Start: 2014-06-19 — End: 2015-07-06

## 2014-06-19 NOTE — Progress Notes (Signed)
Subjective:  This chart was scribed for Elvina Sidle MD, by Veverly Fells, at [facility]  This patient was seen in room 3 and the patient's care was started at 2:19 PM.   Patient ID: Sandy Ramsey, female    DOB: December 14, 1975, 39 y.o.   MRN: [id number]  HPI  HPI Comments: Sandy Ramsey is a 39 y.o. female who presents to [facility] for an annual physical.  Patient has had 2 C sections, she is up to date with her pap smears and is sexually active with 1 partner.  She has had a mammogram done. Patient notes she has had a tetanus shot within 10 years.  Patient notes she has had some trouble sleeping and notes she has battled insomnia most of her life.  She notes she is depending on 4-5 tylenol PMs to sleep at night.  Patient has a Education officer, community. Patient notes that a couple of her aunts have had breast cancer.  She is not a smoker. 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   Patient does exercise perhaps twice a week. She works 12 hour shifts so it's difficult to exercise more than that  There are no active problems to display for this patient.  Past Medical History  Diagnosis Date   Hypertension    Anemia    Past Surgical History  Procedure Laterality Date   Tubal ligation     Cesarean section     No Known Allergies Prior to Admission medications   Medication Sig Start Date End Date Taking? Authorizing Provider  metroNIDAZOLE (FLAGYL) 500 MG tablet Take 1 tablet (500 mg total) by mouth 2 (two) times daily. 10/15/13  Yes Gwenlyn Found Copland, MD  valACYclovir (VALTREX) 1000 MG tablet Take 1 tablet (1,000 mg total) by mouth daily. 10/18/13  Yes Pearline Cables, MD   History   Social History   Marital Status: Single    Spouse Name: N/A    Number of Children: N/A   Years of Education: N/A   Occupational History   Not on file.   Social History Main Topics   Smoking status: Never Smoker     Smokeless tobacco: Never Used   Alcohol Use: 2.4 oz/week    2 Glasses of wine, 2 Cans of beer per week     Comment: occasionally   Drug Use: No   Sexual Activity: Yes    Birth Control/ Protection: Surgical   Other Topics Concern   Not on file   Social History Narrative         Review of Systems  Constitutional: Negative for fever and chills.  Psychiatric/Behavioral: Positive for sleep disturbance.       Objective:   Physical Exam  Filed Vitals:   06/19/14 1349  BP: 132/88  Pulse: 86  Temp: 98.8 F (37.1 C)  TempSrc: Oral  Resp: 19  Height: 5' 3.5" (1.613 m)  Weight: 155 lb (70.308 kg)  SpO2: 98%  This is a middle-aged woman in no acute distress, articulate good eye contact and no evidence of depression. HEENT: Unremarkable with normal fundi, EOM intact, TMs normal, oropharynx clear. She has normal dentition  Neck: Supple no adenopathy or thyromegaly Chest: Clear Heart: Regular no murmur Breast exam reveals no suspicious areas including the axilla Abdomen: Soft nontender without HSM or masses Extremities: Good pedal pulses, no skin rashes or edema Patient has reasonable range of motion of  all 4 extremities.    Assessment & Plan:    This chart was scribed in my presence and reviewed by me personally.    ICD-9-CM ICD-10-CM   1. Annual physical exam V70.0 Z00.00 POCT CBC     POCT urinalysis dipstick     Comprehensive metabolic panel  2. Lipid screening V77.91 Z13.220 Lipid panel  3. Insomnia 780.52 G47.00 traZODone (DESYREL) 50 MG tablet   Patient seems to be in good health. We discussed the differences of opinion on mammograms as well as the fact that she does not need a Pap test and she's had normal Pap tests and is monogamous.  We also discussed insomnia and I agreed to give her some medicine for this.  Signed, Elvina SidleKurt Lauenstein, MD

## 2014-06-19 NOTE — Patient Instructions (Signed)
Health Maintenance Adopting a healthy lifestyle and getting preventive care can go a long way to promote health and wellness. Talk with your health care provider about what schedule of regular examinations is right for you. This is a good chance for you to check in with your provider about disease prevention and staying healthy. In between checkups, there are plenty of things you can do on your own. Experts have done a lot of research about which lifestyle changes and preventive measures are most likely to keep you healthy. Ask your health care provider for more information. WEIGHT AND DIET  Eat a healthy diet  Be sure to include plenty of vegetables, fruits, low-fat dairy products, and lean protein.  Do not eat a lot of foods high in solid fats, added sugars, or salt.  Get regular exercise. This is one of the most important things you can do for your health.  Most adults should exercise for at least 150 minutes each week. The exercise should increase your heart rate and make you sweat (moderate-intensity exercise).  Most adults should also do strengthening exercises at least twice a week. This is in addition to the moderate-intensity exercise.  Maintain a healthy weight  Body mass index (BMI) is a measurement that can be used to identify possible weight problems. It estimates body fat based on height and weight. Your health care provider can help determine your BMI and help you achieve or maintain a healthy weight.  For females 25 years of age and older:   A BMI below 18.5 is considered underweight.  A BMI of 18.5 to 24.9 is normal.  A BMI of 25 to 29.9 is considered overweight.  A BMI of 30 and above is considered obese.  Watch levels of cholesterol and blood lipids  You should start having your blood tested for lipids and cholesterol at 41 years of age, then have this test every 5 years.  You may need to have your cholesterol levels checked more often if:  Your lipid or  cholesterol levels are high.  You are older than 41 years of age.  You are at high risk for heart disease.  CANCER SCREENING   Lung Cancer  Lung cancer screening is recommended for adults 97-92 years old who are at high risk for lung cancer because of a history of smoking.  A yearly low-dose CT scan of the lungs is recommended for people who:  Currently smoke.  Have quit within the past 15 years.  Have at least a 30-pack-year history of smoking. A pack year is smoking an average of one pack of cigarettes a day for 1 year.  Yearly screening should continue until it has been 15 years since you quit.  Yearly screening should stop if you develop a health problem that would prevent you from having lung cancer treatment.  Breast Cancer  Practice breast self-awareness. This means understanding how your breasts normally appear and feel.  It also means doing regular breast self-exams. Let your health care provider know about any changes, no matter how small.  If you are in your 20s or 30s, you should have a clinical breast exam (CBE) by a health care provider every 1-3 years as part of a regular health exam.  If you are 76 or older, have a CBE every year. Also consider having a breast X-ray (mammogram) every year.  If you have a family history of breast cancer, talk to your health care provider about genetic screening.  If you are  at high risk for breast cancer, talk to your health care provider about having an MRI and a mammogram every year.  Breast cancer gene (BRCA) assessment is recommended for women who have family members with BRCA-related cancers. BRCA-related cancers include:  Breast.  Ovarian.  Tubal.  Peritoneal cancers.  Results of the assessment will determine the need for genetic counseling and BRCA1 and BRCA2 testing. Cervical Cancer Routine pelvic examinations to screen for cervical cancer are no longer recommended for nonpregnant women who are considered low  risk for cancer of the pelvic organs (ovaries, uterus, and vagina) and who do not have symptoms. A pelvic examination may be necessary if you have symptoms including those associated with pelvic infections. Ask your health care provider if a screening pelvic exam is right for you.   The Pap test is the screening test for cervical cancer for women who are considered at risk.  If you had a hysterectomy for a problem that was not cancer or a condition that could lead to cancer, then you no longer need Pap tests.  If you are older than 65 years, and you have had normal Pap tests for the past 10 years, you no longer need to have Pap tests.  If you have had past treatment for cervical cancer or a condition that could lead to cancer, you need Pap tests and screening for cancer for at least 20 years after your treatment.  If you no longer get a Pap test, assess your risk factors if they change (such as having a new sexual partner). This can affect whether you should start being screened again.  Some women have medical problems that increase their chance of getting cervical cancer. If this is the case for you, your health care provider may recommend more frequent screening and Pap tests.  The human papillomavirus (HPV) test is another test that may be used for cervical cancer screening. The HPV test looks for the virus that can cause cell changes in the cervix. The cells collected during the Pap test can be tested for HPV.  The HPV test can be used to screen women 30 years of age and older. Getting tested for HPV can extend the interval between normal Pap tests from three to five years.  An HPV test also should be used to screen women of any age who have unclear Pap test results.  After 41 years of age, women should have HPV testing as often as Pap tests.  Colorectal Cancer  This type of cancer can be detected and often prevented.  Routine colorectal cancer screening usually begins at 41 years of  age and continues through 41 years of age.  Your health care provider may recommend screening at an earlier age if you have risk factors for colon cancer.  Your health care provider may also recommend using home test kits to check for hidden blood in the stool.  A small camera at the end of a tube can be used to examine your colon directly (sigmoidoscopy or colonoscopy). This is done to check for the earliest forms of colorectal cancer.  Routine screening usually begins at age 50.  Direct examination of the colon should be repeated every 5-10 years through 41 years of age. However, you may need to be screened more often if early forms of precancerous polyps or small growths are found. Skin Cancer  Check your skin from head to toe regularly.  Tell your health care provider about any new moles or changes in   moles, especially if there is a change in a mole's shape or color.  Also tell your health care provider if you have a mole that is larger than the size of a pencil eraser.  Always use sunscreen. Apply sunscreen liberally and repeatedly throughout the day.  Protect yourself by wearing long sleeves, pants, a wide-brimmed hat, and sunglasses whenever you are outside. HEART DISEASE, DIABETES, AND HIGH BLOOD PRESSURE   Have your blood pressure checked at least every 1-2 years. High blood pressure causes heart disease and increases the risk of stroke.  If you are between 75 years and 42 years old, ask your health care provider if you should take aspirin to prevent strokes.  Have regular diabetes screenings. This involves taking a blood sample to check your fasting blood sugar level.  If you are at a normal weight and have a low risk for diabetes, have this test once every three years after 41 years of age.  If you are overweight and have a high risk for diabetes, consider being tested at a younger age or more often. PREVENTING INFECTION  Hepatitis B  If you have a higher risk for  hepatitis B, you should be screened for this virus. You are considered at high risk for hepatitis B if:  You were born in a country where hepatitis B is common. Ask your health care provider which countries are considered high risk.  Your parents were born in a high-risk country, and you have not been immunized against hepatitis B (hepatitis B vaccine).  You have HIV or AIDS.  You use needles to inject street drugs.  You live with someone who has hepatitis B.  You have had sex with someone who has hepatitis B.  You get hemodialysis treatment.  You take certain medicines for conditions, including cancer, organ transplantation, and autoimmune conditions. Hepatitis C  Blood testing is recommended for:  Everyone born from [birth year] through 1967.  Anyone with known risk factors for hepatitis C. Sexually transmitted infections (STIs)  You should be screened for sexually transmitted infections (STIs) including gonorrhea and chlamydia if:  You are sexually active and are younger than 41 years of age.  You are older than 41 years of age and your health care provider tells you that you are at risk for this type of infection.  Your sexual activity has changed since you were last screened and you are at an increased risk for chlamydia or gonorrhea. Ask your health care provider if you are at risk.  If you do not have HIV, but are at risk, it may be recommended that you take a prescription medicine daily to prevent HIV infection. This is called pre-exposure prophylaxis (PrEP). You are considered at risk if:  You are sexually active and do not regularly use condoms or know the HIV status of your partner(s).  You take drugs by injection.  You are sexually active with a partner who has HIV. Talk with your health care provider about whether you are at high risk of being infected with HIV. If you choose to begin PrEP, you should first be tested for HIV. You should then be tested every 3 months for  as long as you are taking PrEP.  PREGNANCY   If you are premenopausal and you may become pregnant, ask your health care provider about preconception counseling.  If you may become pregnant, take 400 to 800 micrograms (mcg) of folic acid every day.  If you want to prevent pregnancy, talk to your  health care provider about birth control (contraception). OSTEOPOROSIS AND MENOPAUSE   Osteoporosis is a disease in which the bones lose minerals and strength with aging. This can result in serious bone fractures. Your risk for osteoporosis can be identified using a bone density scan.  If you are 65 years of age or older, or if you are at risk for osteoporosis and fractures, ask your health care provider if you should be screened.  Ask your health care provider whether you should take a calcium or vitamin D supplement to lower your risk for osteoporosis.  Menopause may have certain physical symptoms and risks.  Hormone replacement therapy may reduce some of these symptoms and risks. Talk to your health care provider about whether hormone replacement therapy is right for you.  HOME CARE INSTRUCTIONS   Schedule regular health, dental, and eye exams.  Stay current with your immunizations.   Do not use any tobacco products including cigarettes, chewing tobacco, or electronic cigarettes.  If you are pregnant, do not drink alcohol.  If you are breastfeeding, limit how much and how often you drink alcohol.  Limit alcohol intake to no more than 1 drink per day for nonpregnant women. One drink equals 12 ounces of beer, 5 ounces of wine, or 1 ounces of hard liquor.  Do not use street drugs.  Do not share needles.  Ask your health care provider for help if you need support or information about quitting drugs.  Tell your health care provider if you often feel depressed.  Tell your health care provider if you have ever been abused or do not feel safe at home. Document Released: 12/03/2010  Document Revised: 10/04/2013 Document Reviewed: 04/21/2013 [employer] Patient Information 2015 [employer]. This information is not intended to replace advice given to you by your health care provider. Make sure you discuss any questions you have with your health care provider. Insomnia Insomnia is frequent trouble falling and/or staying asleep. Insomnia can be a long term problem or a short term problem. Both are common. Insomnia can be a short term problem when the wakefulness is related to a certain stress or worry. Long term insomnia is often related to ongoing stress during waking hours and/or poor sleeping habits. Overtime, sleep deprivation itself can make the problem worse. Every little thing feels more severe because you are overtired and your ability to cope is decreased. CAUSES   Stress, anxiety, and depression.  Poor sleeping habits.  Distractions such as TV in the bedroom.  Naps close to bedtime.  Engaging in emotionally charged conversations before bed.  Technical reading before sleep.  Alcohol and other sedatives. They may make the problem worse. They can hurt normal sleep patterns and normal dream activity.  Stimulants such as caffeine for several hours prior to bedtime.  Pain syndromes and shortness of breath can cause insomnia.  Exercise late at night.  Changing time zones may cause sleeping problems (jet lag). It is sometimes helpful to have someone observe your sleeping patterns. They should look for periods of not breathing during the night (sleep apnea). They should also look to see how long those periods last. If you live alone or observers are uncertain, you can also be observed at a sleep clinic where your sleep patterns will be professionally monitored. Sleep apnea requires a checkup and treatment. Give your caregivers your medical history. Give your caregivers observations your family has made about your sleep.  SYMPTOMS   Not feeling rested in the  morning.  Anxiety   and restlessness at bedtime.  Difficulty falling and staying asleep. TREATMENT   Your caregiver may prescribe treatment for an underlying medical disorders. Your caregiver can give advice or help if you are using alcohol or other drugs for self-medication. Treatment of underlying problems will usually eliminate insomnia problems.  Medications can be prescribed for short time use. They are generally not recommended for lengthy use.  Over-the-counter sleep medicines are not recommended for lengthy use. They can be habit forming.  You can promote easier sleeping by making lifestyle changes such as:  Using relaxation techniques that help with breathing and reduce muscle tension.  Exercising earlier in the day.  Changing your diet and the time of your last meal. No night time snacks.  Establish a regular time to go to bed.  Counseling can help with stressful problems and worry.  Soothing music and white noise may be helpful if there are background noises you cannot remove.  Stop tedious detailed work at least one hour before bedtime. HOME CARE INSTRUCTIONS   Keep a diary. Inform your caregiver about your progress. This includes any medication side effects. See your caregiver regularly. Take note of:  Times when you are asleep.  Times when you are awake during the night.  The quality of your sleep.  How you feel the next day. This information will help your caregiver care for you.  Get out of bed if you are still awake after 15 minutes. Read or do some quiet activity. Keep the lights down. Wait until you feel sleepy and go back to bed.  Keep regular sleeping and waking hours. Avoid naps.  Exercise regularly.  Avoid distractions at bedtime. Distractions include watching television or engaging in any intense or detailed activity like attempting to balance the household checkbook.  Develop a bedtime ritual. Keep a familiar routine of bathing, brushing your  teeth, climbing into bed at the same time each night, listening to soothing music. Routines increase the success of falling to sleep faster.  Use relaxation techniques. This can be using breathing and muscle tension release routines. It can also include visualizing peaceful scenes. You can also help control troubling or intruding thoughts by keeping your mind occupied with boring or repetitive thoughts like the old concept of counting sheep. You can make it more creative like imagining planting one beautiful flower after another in your backyard garden.  During your day, work to eliminate stress. When this is not possible use some of the previous suggestions to help reduce the anxiety that accompanies stressful situations. MAKE SURE YOU:   Understand these instructions.  Will watch your condition.  Will get help right away if you are not doing well or get worse. Document Released: 05/17/2000 Document Revised: 08/12/2011 Document Reviewed: 06/17/2007 [facility] Patient Information 2015 [place], [place]. This information is not intended to replace advice given to you by your health care provider. Make sure you discuss any questions you have with your health care provider.

## 2015-05-08 ENCOUNTER — Other Ambulatory Visit: Payer: Self-pay | Admitting: Family Medicine

## 2015-06-23 ENCOUNTER — Encounter: Payer: Self-pay | Admitting: Family Medicine

## 2015-06-28 ENCOUNTER — Encounter: Payer: Self-pay | Admitting: Family Medicine

## 2015-07-06 ENCOUNTER — Other Ambulatory Visit: Payer: Self-pay | Admitting: Family Medicine

## 2015-08-10 ENCOUNTER — Other Ambulatory Visit: Payer: Self-pay | Admitting: Family Medicine

## 2018-01-31 ENCOUNTER — Encounter (HOSPITAL_BASED_OUTPATIENT_CLINIC_OR_DEPARTMENT_OTHER): Payer: Self-pay | Admitting: Emergency Medicine

## 2018-01-31 ENCOUNTER — Other Ambulatory Visit: Payer: Self-pay

## 2018-01-31 ENCOUNTER — Emergency Department (HOSPITAL_BASED_OUTPATIENT_CLINIC_OR_DEPARTMENT_OTHER): Payer: BLUE CROSS/BLUE SHIELD

## 2018-01-31 ENCOUNTER — Emergency Department (HOSPITAL_BASED_OUTPATIENT_CLINIC_OR_DEPARTMENT_OTHER)
Admission: EM | Admit: 2018-01-31 | Discharge: 2018-01-31 | Disposition: A | Discharge status: Home / Self Care | Payer: BLUE CROSS/BLUE SHIELD | Attending: Emergency Medicine | Admitting: Emergency Medicine

## 2018-01-31 DIAGNOSIS — I10 Essential (primary) hypertension: Secondary | ICD-10-CM | POA: Diagnosis not present

## 2018-01-31 DIAGNOSIS — M79672 Pain in left foot: Secondary | ICD-10-CM | POA: Diagnosis present

## 2018-01-31 MED ORDER — IBUPROFEN 400 MG PO TABS
600.0000 mg | ORAL_TABLET | Freq: Once | ORAL | Status: AC
  Administered 2018-01-31: 600 mg via ORAL
  Filled 2018-01-31: qty 1

## 2018-01-31 NOTE — Discharge Instructions (Addendum)
Xray reassuring, no broken bones.  Take ibuprofen every 6hrs for pain.   Ice foot at least twice a day and keep elevated.   Follow up with your regular doctor in a week if symptoms are not improving.

## 2018-01-31 NOTE — ED Triage Notes (Signed)
PT presents with c/o left foot pain. Pt states she was walking on side walk and felt a pop and started having pain

## 2018-01-31 NOTE — ED Notes (Signed)
PMS intact before and after. Pt tolerated well. All questions answered. 

## 2018-01-31 NOTE — ED Provider Notes (Signed)
[facility] EMERGENCY DEPARTMENT Provider Note   CSN: [id number] Arrival date & time: 01/31/18  1954     History   Chief Complaint Chief Complaint  Patient presents with  . Foot Pain    HPI Sandy Ramsey is a 42 y.o. female.  HPI   Sandy Ramsey is a 42yo female with a history of plantar fascitis and hypertension who presents to the Emergency Department for evaluation of left foot pain.  Patient states that she was walking on the concrete in flip-flops when she suddenly felt a popping sensation on the bottom of her foot.  She had immediate pain following.  She reports pain is located over the plantar aspect of the foot and feels throbbing, worsened with weightbearing or with dorsiflexion.  She has a history of plantar fasciitis, but states that this is worse than her usual symptoms.  No medications prior to arrival.  She denies injury or twisting of the foot or ankle.  She denies numbness, weakness, open wound, foot swelling.  Past Medical History:  Diagnosis Date  . Anemia   . Hypertension     There are no active problems to display for this patient.   Past Surgical History:  Procedure Laterality Date  . CESAREAN SECTION    . TUBAL LIGATION       OB History   None      Home Medications    Prior to Admission medications   Medication Sig Start Date End Date Taking? Authorizing Provider  metroNIDAZOLE (FLAGYL) 500 MG tablet Take 1 tablet (500 mg total) by mouth 2 (two) times daily. 10/15/13   Copland, Gwenlyn Found, MD  traZODone (DESYREL) 50 MG tablet TAKE 1/2 TO 1 TABLET BY MOUTH EVERY NIGHT AT BEDTIME AS NEEDED FOR SLEEP 08/11/15   Weber, Dema Severin, PA-C  valACYclovir (VALTREX) 1000 MG tablet Take 1 tablet (1,000 mg total) by mouth daily. PATIENT NEEDS OFFICE VISIT FOR ADDITIONAL REFILLS 05/10/15   Garnetta Buddy, PA    Family History Family History  Problem Relation Age of Onset  . Hypertension Mother   . Hyperlipidemia Mother   . Hypertension  Father   . Diabetes Father   . Cancer Maternal Grandmother   . Cancer Maternal Grandfather   . Cancer Paternal Grandmother   . Cancer Paternal Grandfather     Social History Social History   Tobacco Use  . Smoking status: Never Smoker  . Smokeless tobacco: Never Used  Substance Use Topics  . Alcohol use: Yes    Alcohol/week: 4.0 standard drinks    Types: 2 Glasses of wine, 2 Cans of beer per week    Comment: occasionally  . Drug use: No     Allergies   Patient has no known allergies.   Review of Systems Review of Systems  Constitutional: Negative for chills and fever.  Musculoskeletal: Positive for arthralgias (left foot). Negative for joint swelling.  Skin: Negative for color change and wound.  Neurological: Negative for weakness and numbness.     Physical Exam Updated Vital Signs BP (!) 141/94 (BP Location: Left Arm)   Pulse 81   Temp 98.4 F (36.9 C) (Oral)   Resp 16   Ht 5\' 3"  (1.6 m)   Wt 71.7 kg   LMP 01/31/2018   SpO2 100%   BMI 27.99 kg/m   Physical Exam  Constitutional: She is oriented to person, place, and time. She appears well-developed and well-nourished. No distress.  HENT:  Head: Normocephalic and atraumatic.  Eyes: Right eye exhibits no discharge. Left eye exhibits no discharge.  Pulmonary/Chest: Effort normal. No respiratory distress.  Musculoskeletal:  Left foot with pain diffusely over the plantar aspect of the foot as well as over the insertion of the Achilles into the calcaneus.  Full active dorsiflexion/plantarflexion.  Achilles intact, negative Thompson test.  No ankle or foot swelling, erythema, break in skin or bruising.  No pain over the navicular bone or base of the fifth metatarsal.  DP pulses 2+ and symmetric bilaterally.  Sensation to light touch intact.  Neurological: She is alert and oriented to person, place, and time. Coordination normal.  Skin: Skin is warm and dry. Capillary refill takes less than 2 seconds. She is not  diaphoretic.  Psychiatric: She has a normal mood and affect. Her behavior is normal.  Nursing note and vitals reviewed.   ED Treatments / Results  Labs (all labs ordered are listed, but only abnormal results are displayed) Labs Reviewed - No data to display  EKG None  Radiology Dg Foot Complete Left  Result Date: 01/31/2018 CLINICAL DATA:  Patient with pop in the foot.  Initial encounter. EXAM: LEFT FOOT - COMPLETE 3+ VIEW COMPARISON:  None. FINDINGS: There is no evidence of fracture or dislocation. There is no evidence of arthropathy or other focal bone abnormality. Soft tissues are unremarkable. IMPRESSION: No acute osseous abnormality. Electronically Signed   By: Annia Beltrew  Davis M.D.   On: 01/31/2018 20:56    Procedures Procedures (including critical care time)  Medications Ordered in ED Medications  ibuprofen (ADVIL,MOTRIN) tablet 600 mg (has no administration in time range)     Initial Impression / Assessment and Plan / ED Course  I have reviewed the triage vital signs and the nursing notes.  Pertinent labs & imaging results that were available during my care of the patient were reviewed by me and considered in my medical decision making (see chart for details).     X-ray left foot negative for acute fracture or abnormality.  Foot is neurovascularly intact on exam.  Symptoms likely exacerbation of plantar fasciitis given location of pain and worsened with passive dorsiflexion.  She may also have a component of Achilles tendinitis given pain over achilles tendon insertion.  No concern for Achilles tear given exam findings.  She is able to ambulate independently, but reports significant discomfort.  Will discharge home with crutches and slow return to weightbearing.  I have discussed RICE protocol and NSAIDs for pain.  We also discussed exercises for plantar fasciitis.  Counseled her to follow-up with her PCP if her symptoms are not improving in a week.  She agrees and appears  reliable.  Final Clinical Impressions(s) / ED Diagnoses   Final diagnoses:  Foot pain, left    ED Discharge Orders    None       Lawrence MarseillesShrosbree, Emily J, PA-C 01/31/18 2329    Shaune PollackIsaacs, Cameron, MD 02/01/18 [phone]

## 2020-01-06 ENCOUNTER — Encounter (HOSPITAL_BASED_OUTPATIENT_CLINIC_OR_DEPARTMENT_OTHER): Payer: Self-pay | Admitting: *Deleted

## 2020-01-06 ENCOUNTER — Other Ambulatory Visit: Payer: Self-pay

## 2020-01-06 ENCOUNTER — Emergency Department (HOSPITAL_BASED_OUTPATIENT_CLINIC_OR_DEPARTMENT_OTHER)
Admission: EM | Admit: 2020-01-06 | Discharge: 2020-01-06 | Disposition: A | Discharge status: Home / Self Care | Payer: BC Managed Care – PPO | Attending: Emergency Medicine | Admitting: Emergency Medicine

## 2020-01-06 ENCOUNTER — Emergency Department (HOSPITAL_BASED_OUTPATIENT_CLINIC_OR_DEPARTMENT_OTHER): Payer: BC Managed Care – PPO

## 2020-01-06 DIAGNOSIS — G43909 Migraine, unspecified, not intractable, without status migrainosus: Secondary | ICD-10-CM | POA: Diagnosis not present

## 2020-01-06 DIAGNOSIS — R519 Headache, unspecified: Secondary | ICD-10-CM | POA: Diagnosis present

## 2020-01-06 DIAGNOSIS — I10 Essential (primary) hypertension: Secondary | ICD-10-CM | POA: Diagnosis not present

## 2020-01-06 DIAGNOSIS — Z79899 Other long term (current) drug therapy: Secondary | ICD-10-CM | POA: Insufficient documentation

## 2020-01-06 HISTORY — DX: Migraine, unspecified, not intractable, without status migrainosus: G43.909

## 2020-01-06 MED ORDER — SODIUM CHLORIDE 0.9 % IV SOLN: INTRAVENOUS | Status: DC

## 2020-01-06 MED ORDER — METOCLOPRAMIDE HCL 5 MG/ML IJ SOLN
10.0000 mg | Freq: Once | INTRAMUSCULAR | Status: AC
  Administered 2020-01-06: 10 mg via INTRAVENOUS
  Filled 2020-01-06: qty 2

## 2020-01-06 MED ORDER — DEXAMETHASONE SODIUM PHOSPHATE 10 MG/ML IJ SOLN
10.0000 mg | Freq: Once | INTRAMUSCULAR | Status: AC
  Administered 2020-01-06: 10 mg via INTRAVENOUS
  Filled 2020-01-06: qty 1

## 2020-01-06 MED ORDER — DIPHENHYDRAMINE HCL 50 MG/ML IJ SOLN
25.0000 mg | Freq: Once | INTRAMUSCULAR | Status: AC
  Administered 2020-01-06: 25 mg via INTRAVENOUS
  Filled 2020-01-06: qty 1

## 2020-01-06 MED ORDER — SODIUM CHLORIDE 0.9 % IV BOLUS
1000.0000 mL | Freq: Once | INTRAVENOUS | Status: AC
  Administered 2020-01-06: 1000 mL via INTRAVENOUS

## 2020-01-06 NOTE — ED Notes (Signed)
ED Provider at bedside. 

## 2020-01-06 NOTE — ED Triage Notes (Signed)
Migraine since this am. She has not seen a doctor for migraines in years so she did not have medications to take.

## 2020-01-06 NOTE — ED Provider Notes (Signed)
[facility] EMERGENCY DEPARTMENT Provider Note   CSN: [id number] Arrival date & time: 01/06/20  1610     History Chief Complaint  Patient presents with  . Migraine    Sandy Ramsey is a 44 y.o. female.  Patient with known history of migraines.  About 3 this morning had a sudden onset of right-sided headache with nausea vomiting and photophobia.  This persisted on the right side kind of front to back.  That part is somewhat similar to her headaches.  But the acute onset of the headache is unusual.  Usually they do not come on so suddenly.  Patient has not had any migraines recently.  But has had them in the past.  No other than the acute onset of this the rest of this is very similar to her migraines in the past.        Past Medical History:  Diagnosis Date  . Anemia   . Hypertension   . Migraine headache     There are no problems to display for this patient.   Past Surgical History:  Procedure Laterality Date  . CESAREAN SECTION    . TUBAL LIGATION       OB History   No obstetric history on file.     Family History  Problem Relation Age of Onset  . Hypertension Mother   . Hyperlipidemia Mother   . Hypertension Father   . Diabetes Father   . Cancer Maternal Grandmother   . Cancer Maternal Grandfather   . Cancer Paternal Grandmother   . Cancer Paternal Grandfather     Social History   Tobacco Use  . Smoking status: Never Smoker  . Smokeless tobacco: Never Used  Substance Use Topics  . Alcohol use: Yes    Alcohol/week: 4.0 standard drinks    Types: 2 Glasses of wine, 2 Cans of beer per week    Comment: occasionally  . Drug use: No    Home Medications Prior to Admission medications   Medication Sig Start Date End Date Taking? Authorizing Provider  metroNIDAZOLE (FLAGYL) 500 MG tablet Take 1 tablet (500 mg total) by mouth 2 (two) times daily. 10/15/13   Copland, Gwenlyn Found, MD  traZODone (DESYREL) 50 MG tablet TAKE 1/2 TO 1 TABLET BY MOUTH  EVERY NIGHT AT BEDTIME AS NEEDED FOR SLEEP 08/11/15   Weber, Dema Severin, PA-C  valACYclovir (VALTREX) 1000 MG tablet Take 1 tablet (1,000 mg total) by mouth daily. PATIENT NEEDS OFFICE VISIT FOR ADDITIONAL REFILLS 05/10/15   Trena Platt D, PA    Allergies    Patient has no known allergies.  Review of Systems   Review of Systems  Constitutional: Negative for chills and fever.  HENT: Negative for congestion, rhinorrhea and sore throat.   Eyes: Positive for photophobia. Negative for visual disturbance.  Respiratory: Negative for cough and shortness of breath.   Cardiovascular: Negative for chest pain and leg swelling.  Gastrointestinal: Positive for nausea. Negative for abdominal pain, diarrhea and vomiting.  Genitourinary: Negative for dysuria.  Musculoskeletal: Negative for back pain and neck pain.  Skin: Negative for rash.  Neurological: Positive for headaches. Negative for dizziness and light-headedness.  Hematological: Does not bruise/bleed easily.  Psychiatric/Behavioral: Negative for confusion.    Physical Exam Updated Vital Signs BP (!) 177/99   Pulse 97 Comment: Pt is crying  Temp 98.7 F (37.1 C) (Oral)   Resp 14   Ht 1.6 m (5\' 3" )   Wt 72.6 kg   LMP  01/02/2020   SpO2 99%   BMI 28.34 kg/m   Physical Exam Vitals and nursing note reviewed.  Constitutional:      General: She is not in acute distress.    Appearance: Normal appearance. She is well-developed.  HENT:     Head: Normocephalic and atraumatic.  Eyes:     Extraocular Movements: Extraocular movements intact.     Conjunctiva/sclera: Conjunctivae normal.     Pupils: Pupils are equal, round, and reactive to light.  Cardiovascular:     Rate and Rhythm: Normal rate and regular rhythm.     Heart sounds: No murmur heard.   Pulmonary:     Effort: Pulmonary effort is normal. No respiratory distress.     Breath sounds: Normal breath sounds.  Abdominal:     Palpations: Abdomen is soft.     Tenderness: There  is no abdominal tenderness.  Musculoskeletal:        General: Normal range of motion.     Cervical back: Normal range of motion and neck supple.  Skin:    General: Skin is warm and dry.     Capillary Refill: Capillary refill takes less than 2 seconds.  Neurological:     General: No focal deficit present.     Mental Status: She is alert and oriented to person, place, and time.     Cranial Nerves: No cranial nerve deficit.     Sensory: No sensory deficit.     Motor: No weakness.     ED Results / Procedures / Treatments   Labs (all labs ordered are listed, but only abnormal results are displayed) Labs Reviewed - No data to display  EKG None  Radiology No results found.  Procedures Procedures (including critical care time)  Medications Ordered in ED Medications  0.9 %  sodium chloride infusion (has no administration in time range)  sodium chloride 0.9 % bolus 1,000 mL (has no administration in time range)  dexamethasone (DECADRON) injection 10 mg (has no administration in time range)  metoCLOPramide (REGLAN) injection 10 mg (has no administration in time range)  diphenhydrAMINE (BENADRYL) injection 25 mg (has no administration in time range)    ED Course  I have reviewed the triage vital signs and the nursing notes.  Pertinent labs & imaging results that were available during my care of the patient were reviewed by me and considered in my medical decision making (see chart for details).    MDM Rules/Calculators/A&P                          Head CT negative.  Patient treated with migraine cocktail Decadron Reglan and Benadryl and 1 L of fluid feels much much better.  Symptoms most likely consistent with migraine headache.  Patient stable for discharge home.   If headaches persist or do not resolve will require additional follow-up.  May require MRI of brain.    Final Clinical Impression(s) / ED Diagnoses Final diagnoses:  None    Rx / DC Orders ED  Discharge Orders    None       Vanetta Mulders, MD 01/06/20 2032

## 2020-01-06 NOTE — ED Notes (Signed)
Pt. Reports she started with a migraine headache that hit her on the right side behind the R eye.  Pt. Reports she took motrin and tylenol for this with no relief.  Pt. Reports she gets this with her periods from time to time.  Pt. Reports her headache is better after the migraine cocktail.

## 2020-01-06 NOTE — Discharge Instructions (Signed)
Head CT was negative.  Symptoms most likely consistent with migraine headache.  Commend resting in a dark room at home.  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  Return for any new or worse symptoms.  If headache persists and does not resolve will need follow-up and may need MRI brain.

## 2021-02-06 IMAGING — CT CT HEAD W/O CM
3 series · 15 of 47 positions shown, 18 images · non-contrast
Comparison: None.

CLINICAL DATA: Headache

EXAM:
CT HEAD WITHOUT CONTRAST
TECHNIQUE: Contiguous axial images were obtained from the base of the skull
through the vertex without intravenous contrast.

[Series 2: head wo · axial · 0.43mm/px · z∈[-226,-102]mm · 9 of 30 slices shown, 12 images]
[im 3/30  brain]
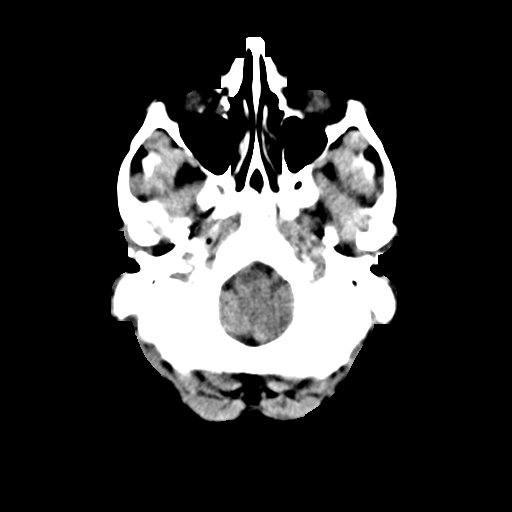
[im 3/30  bone]
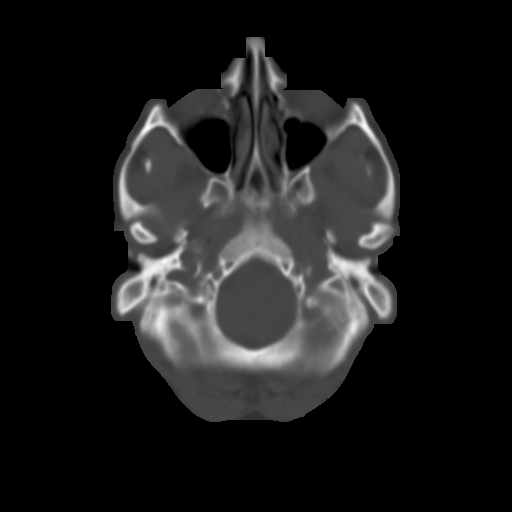
[im 6/30  brain]
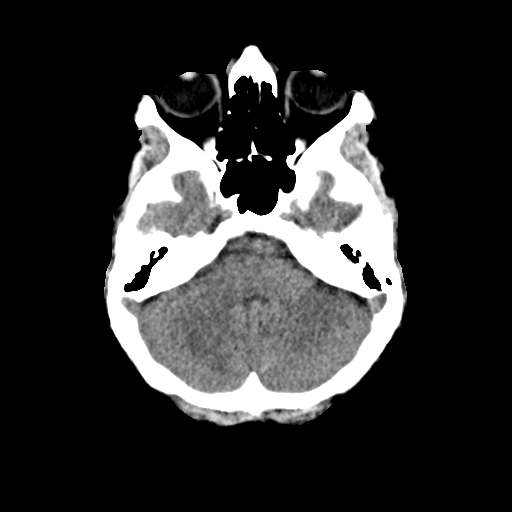
[im 9/30  brain]
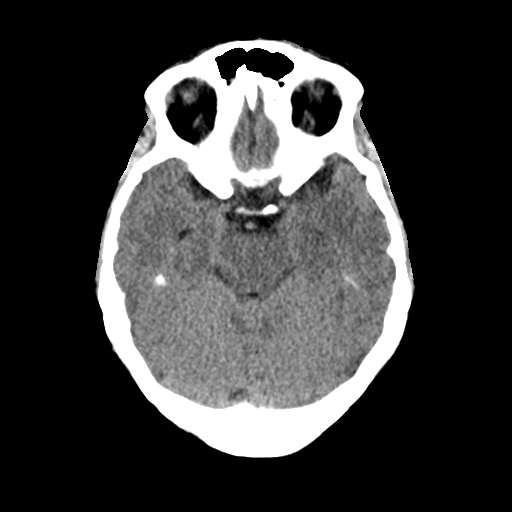
[im 12/30  brain]
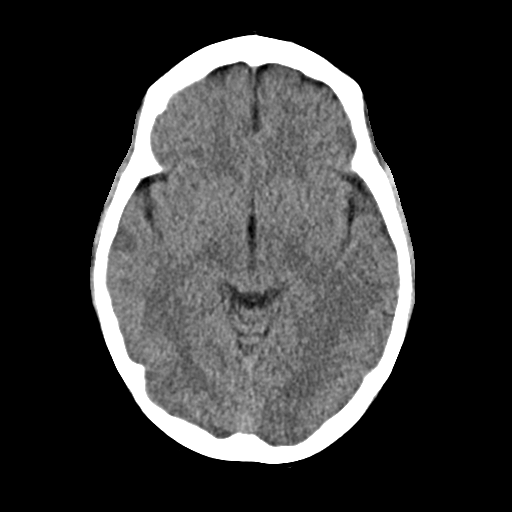
[im 16/30  brain]
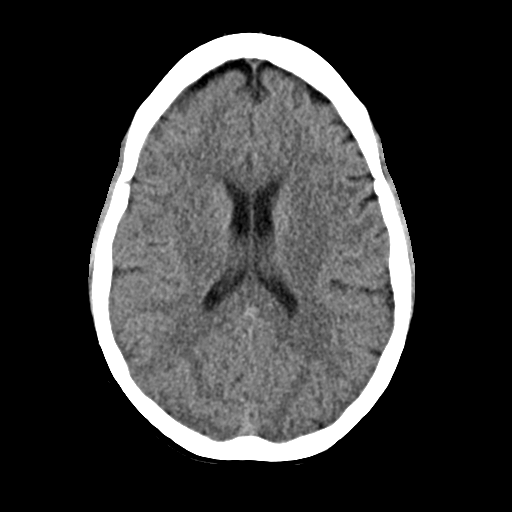
[im 16/30  bone]
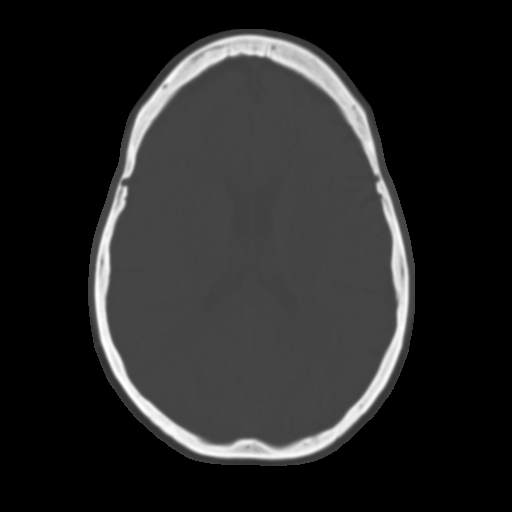
[im 19/30  brain]
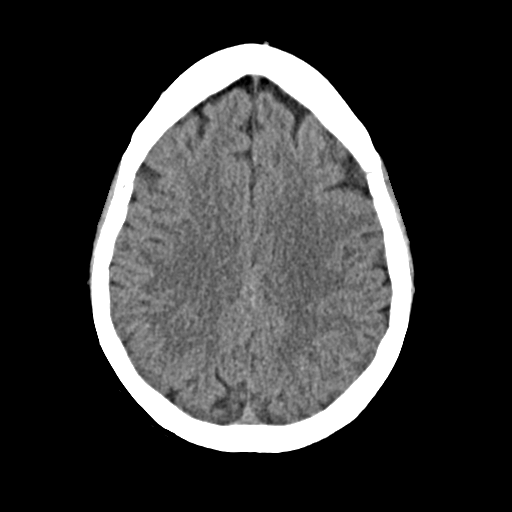
[im 22/30  brain]
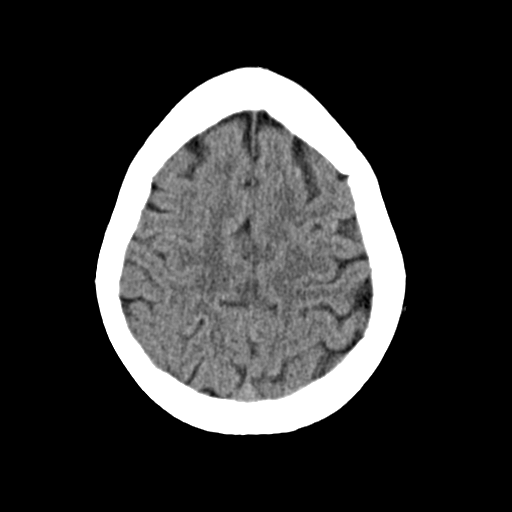
[im 25/30  brain]
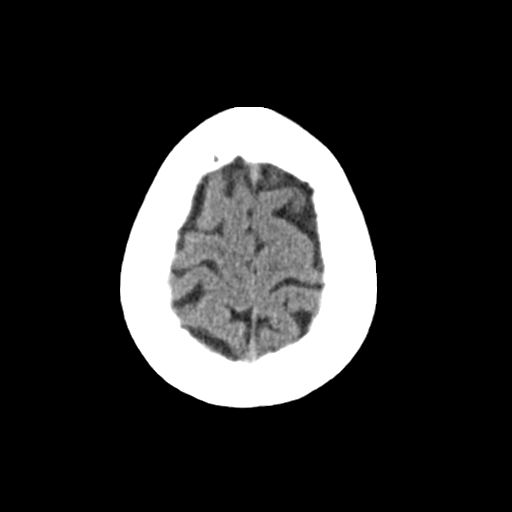
[im 28/30  brain]
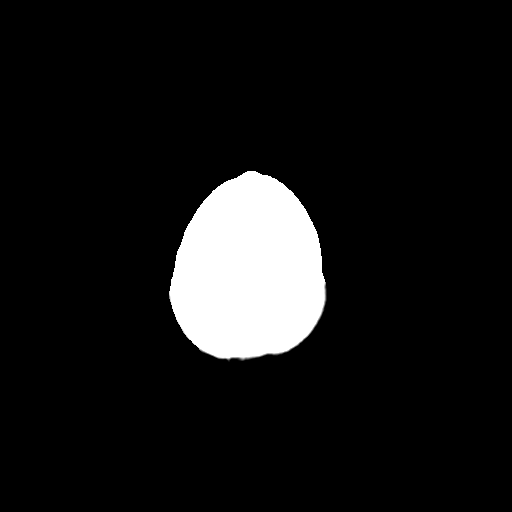
[im 28/30  bone]
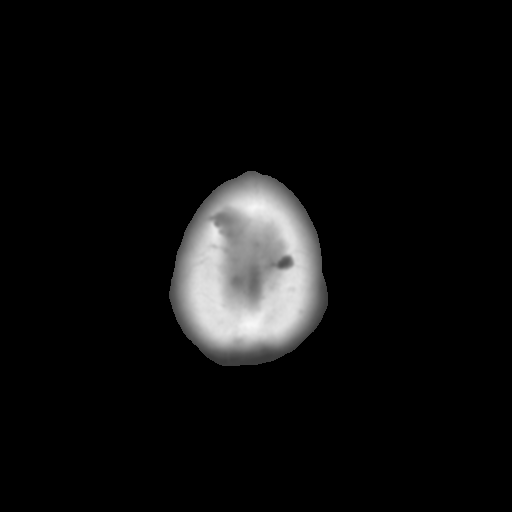

[Series 4: coronal soft · coronal · 0.31mm/px · 3 of 69 slices shown]
[im 23/69  brain]
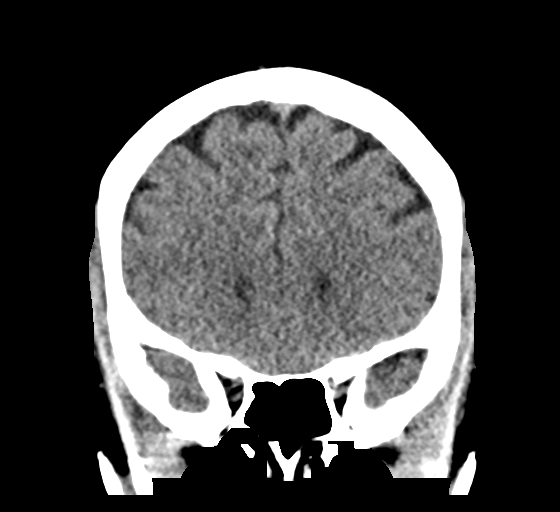
[im 31/69  brain]
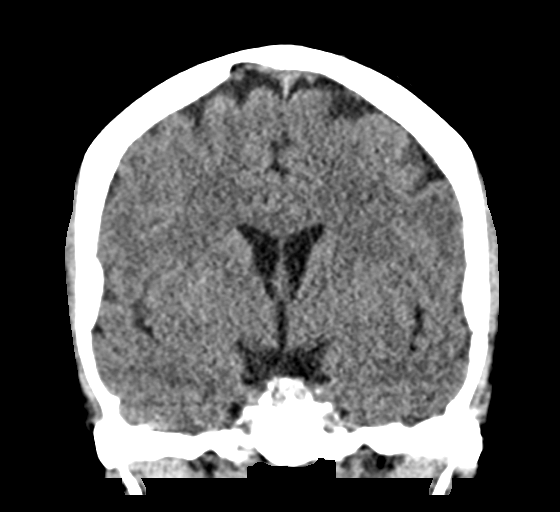
[im 38/69  brain]
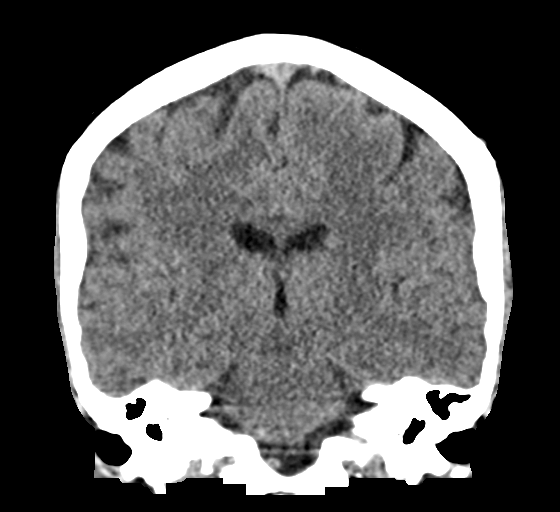

[Series 5: sag soft · sagittal · 0.32mm/px · 3 of 57 slices shown]
[im 19/57  brain]
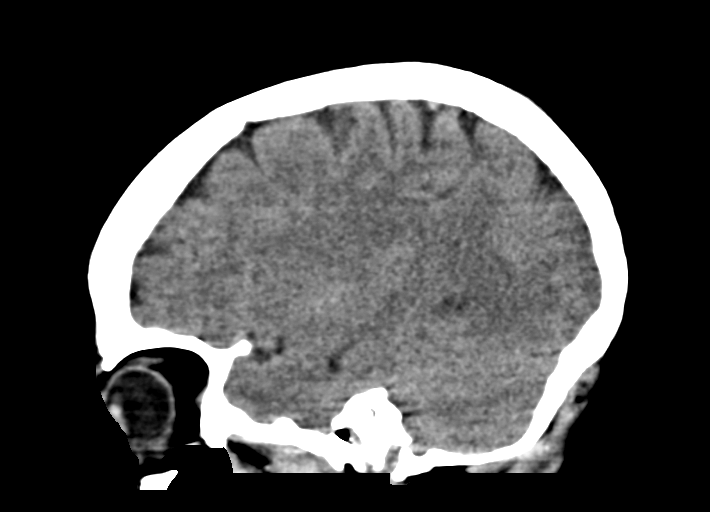
[im 29/57  brain]
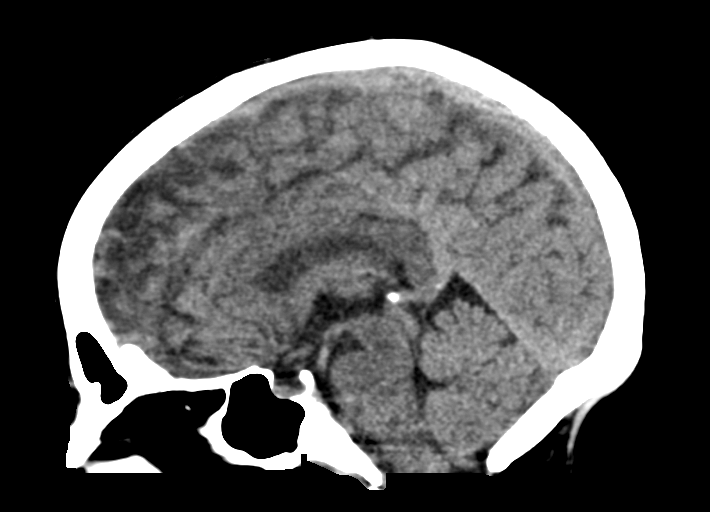
[im 38/57  brain]
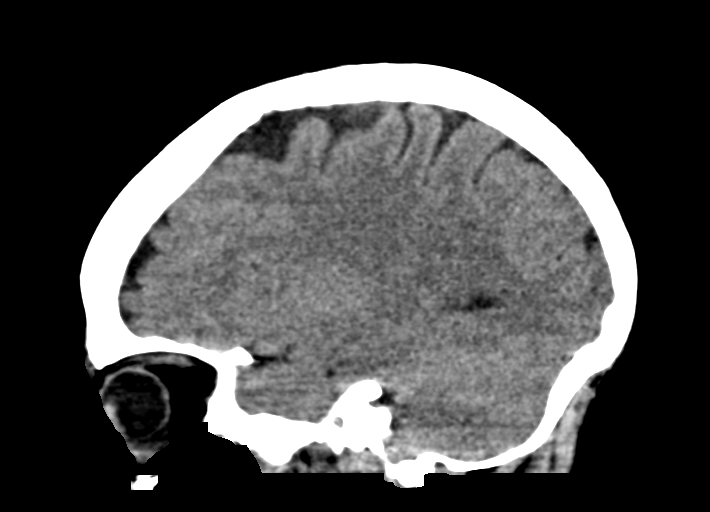

[15 of 47 positions shown; findings below may reference images not displayed]

FINDINGS: Brain: No evidence of acute infarction, hemorrhage, hydrocephalus,
extra-axial collection or mass lesion/mass effect.

Vascular: No hyperdense vessel or unexpected calcification.

Skull: Normal. Negative for fracture or focal lesion.

Sinuses/Orbits: No acute finding.

Other: None
IMPRESSION: Negative non contrasted CT appearance of the brain.

## 2021-09-20 ENCOUNTER — Other Ambulatory Visit: Payer: Self-pay | Admitting: Obstetrics and Gynecology

## 2021-09-24 NOTE — H&P (Signed)
Preoperative History and Physical ? ?Sandy Ramsey is a 46 y.o. [id number] here for surgical management of Menorrhagia with irregular cycle.   No significant preoperative concerns. ? ?History of Present Illness: 46 y.o. [id number] female who presents to discuss endometrial ablation.  She has had menstrual problems her whole life.  She had a Liletta IUD. This helped a little with migraines.  She bled nearly the whole 14 months she had the IUD.  Sometimes she would have spotting.  She has been on various forms of birth control.  She is just ready to be done.  She also has migraines when she has migraines with her periods.   ?07/2021 pap smear: NILM, HPV negative ? ?Proposed surgery: Hysteroscopy, dilation and curettage, endometrial ablation ? ?Past Medical History:  ?Diagnosis Date  ? Anemia   ? on and off  ? Depression   ? Fibroid   ? Hypertension   ? Systemic lupus erythematosus (CMS-HCC)   ? ?Past Surgical History:  ?Procedure Laterality Date  ? TUBAL LIGATION  2005  ? [place], 2005  ? ?OB History  ?Gravida Para Term Preterm AB Living  ?2 2 2     2   ?SAB IAB Ectopic Molar Multiple Live Births  ?          2  ?  ?# Outcome Date GA Lbr Len/2nd Weight Sex Delivery Anes PTL Lv  ?2 Term 09/13/03    F CS-LTranv   LIV  ?1 Term 08/09/92    F CS-LTranv   LIV  ?Patient denies any other pertinent gynecologic issues.  ? ?Current Outpatient Medications on File Prior to Visit  ?Medication Sig Dispense Refill  ? cannabidiol (EPIDIOLEX) 100 mg/mL oral solution Take 5 mg/kg by mouth 2 (two) times daily CBD- Takes orally and uses topically.    ? norethindrone (AYGESTIN) 5 mg tablet For 3 days take 3 pills daily, then 2 pills for 3 days, then down to one pill daily for rest of pack (Patient not taking: Reported on 07/23/2021) 30 tablet 1  ? ?No current facility-administered medications on file prior to visit.  ? ?No Known Allergies ? ?Social History:   reports that she has never smoked. She has never used smokeless tobacco.  She reports current alcohol use. She reports that she does not use drugs. ? ?Family History  ?Problem Relation Age of Onset  ? Diabetes Father   ? High blood pressure (Hypertension) Father   ? Breast cancer Paternal Aunt   ? Other Paternal Grandmother   ?     cervical, ovarian unsure  ? Breast cancer Paternal Aunt   ? ? ?Review of Systems: Noncontributory ? ?PHYSICAL EXAM: ?Blood pressure 131/88, pulse 83, height 162.6 cm (5\' 4" ), weight 72.6 kg (160 lb). ?CONSTITUTIONAL: Well-developed, well-nourished female in no acute distress.  ?HENT:  Normocephalic, atraumatic, External right and left ear normal. Oropharynx is clear and moist ?EYES: Conjunctivae and EOM are normal. Pupils are equal, round, and reactive to light. No scleral icterus.  ?NECK: Normal range of motion, supple, no masses ?SKIN: Skin is warm and dry. No rash noted. Not diaphoretic. No erythema. No pallor. ?[place]: Alert and oriented to person, place, and time. Normal reflexes, muscle tone coordination. No cranial nerve deficit noted. ?PSYCHIATRIC: Normal mood and affect. Normal behavior. Normal judgment and thought content. ?CARDIOVASCULAR: Normal heart rate noted, regular rhythm ?RESPIRATORY: Effort and breath sounds normal, no problems with respiration noted ?ABDOMEN: Soft, nontender, nondistended. ?PELVIC: Deferred ?MUSCULOSKELETAL: Normal range of motion. No  edema and no tenderness. 2+ distal pulses. ? ?Labs: ?No results found for this or any previous visit (from the past 336 hour(s)). ? ?Imaging Studies: ?No results found. ? ?Assessment: ?1. Menorrhagia with irregular cycle   ?  ?Plan: ?Patient will undergo surgical management with the above-noted surgery.   The risks of surgery were discussed in detail with the patient including but not limited to: bleeding which may require transfusion or reoperation; infection which may require antibiotics; injury to surrounding organs which may involve bowel, bladder, ureters ; need for additional  procedures including laparoscopy or laparotomy; thromboembolic phenomenon, surgical site problems and other postoperative/anesthesia complications. Likelihood of success in alleviating the patient's condition was discussed. Routine postoperative instructions will be reviewed with the patient and her family in detail after surgery.  The patient concurred with the proposed plan, giving informed written consent for the surgery.  Preoperative prophylactic antibiotics, as indicated, and SCDs ordered on call to the OR.  Since she just had an IUD, her risk of endometrial hyperplasia (EIN) or cancer is very low. Additionally, an endometrial biopsy will be performed as part of the procedure.  ? ?Prentice Docker, MD ?09/24/2021 3:19 PM   ?

## 2021-09-26 ENCOUNTER — Inpatient Hospital Stay
Admission: RE | Admit: 2021-09-26 | Discharge: 2021-09-26 | Disposition: A | Discharge status: Home / Self Care | Payer: BC Managed Care – PPO | Source: Ambulatory Visit

## 2021-09-26 NOTE — Patient Instructions (Signed)
Your procedure is scheduled on: 10/02/21 - Tuesday ?Report to the Registration Desk on the 1st floor of the [facility]. ?To find out your arrival time, please call [phone] between 1PM - 3PM on: 10/01/21 - Monday ? ?REMEMBER: ?Instructions that are not followed completely may result in serious medical risk, up to and including death; or upon the discretion of your surgeon and anesthesiologist your surgery may need to be rescheduled. ? ?Do not eat food after midnight the night before surgery.  ?No gum chewing, lozengers or hard candies. ? ?You may however, drink CLEAR liquids up to 2 hours before you are scheduled to arrive for your surgery. Do not drink anything within 2 hours of your scheduled arrival time. ? ?Clear liquids include: ?- water  ?- apple juice without pulp ?- gatorade (not RED colors) ?- black coffee or tea (Do NOT add milk or creamers to the coffee or tea) ?Do NOT drink anything that is not on this list. ? ?In addition, your doctor has ordered for you to drink the provided  ?Ensure Pre-Surgery Clear Carbohydrate Drink  ?Drinking this carbohydrate drink up to two hours before surgery helps to reduce insulin resistance and improve patient outcomes. Please complete drinking 2 hours prior to scheduled arrival time. ? ?TAKE THESE MEDICATIONS THE MORNING OF SURGERY WITH A SIP OF WATER: NONE ? ?One week prior to surgery: meloxicam (MOBIC) 15 MG tablet ?Stop Anti-inflammatories (NSAIDS) such as Advil, Aleve, Ibuprofen, Motrin, Naproxen, Naprosyn and Aspirin based products such as Excedrin, Goodys Powder, BC Powder. ? ?Stop ANY OVER THE COUNTER supplements until after surgery. ? ?You may however, continue to take Tylenol if needed for pain up until the day of surgery. ? ?No Alcohol for 24 hours before or after surgery. ? ?No Smoking including e-cigarettes for 24 hours prior to surgery.  ?No chewable tobacco products for at least 6 hours prior to surgery.  ?No nicotine patches on the day of  surgery. ? ?Do not use any "recreational" drugs for at least a week prior to your surgery.  ?Please be advised that the combination of cocaine and anesthesia may have negative outcomes, up to and including death. ?If you test positive for cocaine, your surgery will be cancelled. ? ?On the morning of surgery brush your teeth with toothpaste and water, you may rinse your mouth with mouthwash if you wish. ?Do not swallow any toothpaste or mouthwash. ? ?Do not wear jewelry, make-up, hairpins, clips or nail polish. ? ?Do not wear lotions, powders, or perfumes.  ? ?Do not shave body from the neck down 48 hours prior to surgery just in case you cut yourself which could leave a site for infection.  ?Also, freshly shaved skin may become irritated if using the CHG soap. ? ?Contact lenses, hearing aids and dentures may not be worn into surgery. ? ?Do not bring valuables to the hospital. [facility] is not responsible for any missing/lost belongings or valuables.  ? ?Notify your doctor if there is any change in your medical condition (cold, fever, infection). ? ?Wear comfortable clothing (specific to your surgery type) to the hospital. ? ?After surgery, you can help prevent lung complications by doing breathing exercises.  ?Take deep breaths and cough every 1-2 hours. Your doctor may order a device called an Incentive Spirometer to help you take deep breaths. ?When coughing or sneezing, hold a pillow firmly against your incision with both hands. This is called ?splinting.? Doing this helps protect your incision. It also decreases belly  discomfort. ? ?If you are being admitted to the hospital overnight, leave your suitcase in the car. ?After surgery it may be brought to your room. ? ?If you are being discharged the day of surgery, you will not be allowed to drive home. ?You will need a responsible adult (18 years or older) to drive you home and stay with you that night.  ? ?If you are taking public transportation, you will need  to have a responsible adult (18 years or older) with you. ?Please confirm with your physician that it is acceptable to use public transportation.  ? ?Please call the [facility] at [phone] if you have any questions about these instructions. ? ?Surgery Visitation Policy: ? ?Patients undergoing a surgery or procedure may have two family members or support persons with them as long as the person is not COVID-19 positive or experiencing its symptoms.  ? ?Inpatient Visitation:   ? ?Visiting hours are 7 a.m. to 8 p.m. ?Up to four visitors are allowed at one time in a patient room, including children. The visitors may rotate out with other people during the day. One designated support person (adult) may remain overnight.  ?

## 2021-09-26 NOTE — Pre-Procedure Instructions (Signed)
This Probation officer called patient for her scheduled PAT appointment today, she reports to me that she will not be able to proceed with surgery scheduled for 10/02/21 , this writer informed her to notify Dr. Glennon Mac office asap. This Probation officer sent MD a secure chat to make him aware. ?

## 2021-10-02 ENCOUNTER — Ambulatory Visit
Admission: RE | Admit: 2021-10-02 | Payer: Managed Care, Other (non HMO) | Source: Home / Self Care | Admitting: Obstetrics and Gynecology

## 2021-10-02 ENCOUNTER — Encounter: Admission: RE | Payer: Self-pay | Source: Home / Self Care

## 2021-10-02 DIAGNOSIS — N921 Excessive and frequent menstruation with irregular cycle: Secondary | ICD-10-CM

## 2021-10-02 SURGERY — DILATATION & CURETTAGE/HYSTEROSCOPY WITH NOVASURE ABLATION
Anesthesia: Monitor Anesthesia Care
